# Patient Record
Sex: Female | Born: 1940 | Race: White | Hispanic: No | Smoking: Former smoker
Health system: Southern US, Community
[De-identification: ages and names within clinical notes are randomized; demographics above are authoritative.]

## PROBLEM LIST (undated history)

## (undated) DIAGNOSIS — C50919 Malignant neoplasm of unspecified site of unspecified female breast: Secondary | ICD-10-CM

## (undated) DIAGNOSIS — Z853 Personal history of malignant neoplasm of breast: Principal | ICD-10-CM

## (undated) DIAGNOSIS — E781 Pure hyperglyceridemia: Secondary | ICD-10-CM

## (undated) DIAGNOSIS — I10 Essential (primary) hypertension: Secondary | ICD-10-CM

## (undated) DIAGNOSIS — M1990 Unspecified osteoarthritis, unspecified site: Secondary | ICD-10-CM

## (undated) DIAGNOSIS — E039 Hypothyroidism, unspecified: Secondary | ICD-10-CM

## (undated) DIAGNOSIS — C439 Malignant melanoma of skin, unspecified: Secondary | ICD-10-CM

## (undated) HISTORY — PX: MELANOMA EXCISION: SHX5266

## (undated) HISTORY — DX: Essential (primary) hypertension: I10

## (undated) HISTORY — DX: Malignant melanoma of skin, unspecified: C43.9

## (undated) HISTORY — PX: SKIN BIOPSY: SHX1

## (undated) HISTORY — PX: TUBAL LIGATION: SHX77

## (undated) HISTORY — DX: Personal history of malignant neoplasm of breast: Z85.3

## (undated) HISTORY — PX: BREAST LUMPECTOMY: SHX2

## (undated) HISTORY — PX: ECTOPIC PREGNANCY SURGERY: SHX613

## (undated) HISTORY — DX: Unspecified osteoarthritis, unspecified site: M19.90

## (undated) HISTORY — DX: Malignant neoplasm of unspecified site of unspecified female breast: C50.919

## (undated) HISTORY — DX: Pure hyperglyceridemia: E78.1

## (undated) HISTORY — PX: TONSILLECTOMY: SUR1361

---

## 2000-02-07 ENCOUNTER — Encounter: Payer: Self-pay | Admitting: Internal Medicine

## 2000-02-07 ENCOUNTER — Ambulatory Visit (HOSPITAL_COMMUNITY): Admission: RE | Admit: 2000-02-07 | Discharge: 2000-02-07 | Payer: Self-pay | Admitting: Internal Medicine

## 2000-03-09 ENCOUNTER — Other Ambulatory Visit: Admission: RE | Admit: 2000-03-09 | Discharge: 2000-03-09 | Payer: Self-pay | Admitting: *Deleted

## 2000-03-09 ENCOUNTER — Encounter (INDEPENDENT_AMBULATORY_CARE_PROVIDER_SITE_OTHER): Payer: Self-pay

## 2000-06-27 ENCOUNTER — Other Ambulatory Visit: Admission: RE | Admit: 2000-06-27 | Discharge: 2000-06-27 | Payer: Self-pay | Admitting: Internal Medicine

## 2001-02-14 ENCOUNTER — Encounter: Payer: Self-pay | Admitting: Internal Medicine

## 2001-02-14 ENCOUNTER — Ambulatory Visit (HOSPITAL_COMMUNITY): Admission: RE | Admit: 2001-02-14 | Discharge: 2001-02-14 | Payer: Self-pay | Admitting: Internal Medicine

## 2001-07-18 ENCOUNTER — Encounter: Payer: Self-pay | Admitting: Emergency Medicine

## 2001-07-18 ENCOUNTER — Emergency Department (HOSPITAL_COMMUNITY): Admission: EM | Admit: 2001-07-18 | Discharge: 2001-07-18 | Payer: Self-pay | Admitting: Emergency Medicine

## 2001-11-08 ENCOUNTER — Other Ambulatory Visit: Admission: RE | Admit: 2001-11-08 | Discharge: 2001-11-08 | Payer: Self-pay | Admitting: Internal Medicine

## 2002-02-19 ENCOUNTER — Encounter: Payer: Self-pay | Admitting: Internal Medicine

## 2002-02-19 ENCOUNTER — Ambulatory Visit (HOSPITAL_COMMUNITY): Admission: RE | Admit: 2002-02-19 | Discharge: 2002-02-19 | Payer: Self-pay | Admitting: Internal Medicine

## 2003-05-30 ENCOUNTER — Other Ambulatory Visit: Admission: RE | Admit: 2003-05-30 | Discharge: 2003-05-30 | Payer: Self-pay | Admitting: Obstetrics and Gynecology

## 2003-06-16 ENCOUNTER — Ambulatory Visit (HOSPITAL_COMMUNITY): Admission: RE | Admit: 2003-06-16 | Discharge: 2003-06-16 | Payer: Self-pay | Admitting: Obstetrics and Gynecology

## 2003-07-07 ENCOUNTER — Encounter: Admission: RE | Admit: 2003-07-07 | Discharge: 2003-07-07 | Payer: Self-pay | Admitting: Obstetrics and Gynecology

## 2004-07-25 DIAGNOSIS — C439 Malignant melanoma of skin, unspecified: Secondary | ICD-10-CM

## 2004-07-25 HISTORY — DX: Malignant melanoma of skin, unspecified: C43.9

## 2004-07-28 ENCOUNTER — Ambulatory Visit (HOSPITAL_COMMUNITY): Admission: RE | Admit: 2004-07-28 | Discharge: 2004-07-28 | Payer: Self-pay | Admitting: Obstetrics and Gynecology

## 2004-07-29 ENCOUNTER — Other Ambulatory Visit: Admission: RE | Admit: 2004-07-29 | Discharge: 2004-07-29 | Payer: Self-pay | Admitting: Obstetrics and Gynecology

## 2005-09-20 ENCOUNTER — Other Ambulatory Visit: Admission: RE | Admit: 2005-09-20 | Discharge: 2005-09-20 | Payer: Self-pay | Admitting: Obstetrics & Gynecology

## 2005-10-11 ENCOUNTER — Encounter: Admission: RE | Admit: 2005-10-11 | Discharge: 2005-10-11 | Payer: Self-pay | Admitting: Obstetrics and Gynecology

## 2005-11-08 ENCOUNTER — Encounter: Admission: RE | Admit: 2005-11-08 | Discharge: 2005-11-08 | Payer: Self-pay | Admitting: Obstetrics and Gynecology

## 2006-11-03 ENCOUNTER — Ambulatory Visit (HOSPITAL_COMMUNITY): Admission: RE | Admit: 2006-11-03 | Discharge: 2006-11-03 | Payer: Self-pay | Admitting: Family Medicine

## 2006-11-07 ENCOUNTER — Other Ambulatory Visit: Admission: RE | Admit: 2006-11-07 | Discharge: 2006-11-07 | Payer: Self-pay | Admitting: Obstetrics and Gynecology

## 2007-12-06 ENCOUNTER — Ambulatory Visit (HOSPITAL_COMMUNITY): Admission: RE | Admit: 2007-12-06 | Discharge: 2007-12-06 | Payer: Self-pay | Admitting: Family Medicine

## 2008-01-18 ENCOUNTER — Other Ambulatory Visit: Admission: RE | Admit: 2008-01-18 | Discharge: 2008-01-18 | Payer: Self-pay | Admitting: Obstetrics and Gynecology

## 2009-01-22 ENCOUNTER — Ambulatory Visit (HOSPITAL_COMMUNITY): Admission: RE | Admit: 2009-01-22 | Discharge: 2009-01-22 | Payer: Self-pay | Admitting: Obstetrics and Gynecology

## 2009-01-22 DIAGNOSIS — C50919 Malignant neoplasm of unspecified site of unspecified female breast: Secondary | ICD-10-CM

## 2009-01-22 HISTORY — DX: Malignant neoplasm of unspecified site of unspecified female breast: C50.919

## 2009-01-30 ENCOUNTER — Encounter: Admission: RE | Admit: 2009-01-30 | Discharge: 2009-01-30 | Payer: Self-pay | Admitting: Obstetrics and Gynecology

## 2009-01-30 ENCOUNTER — Encounter (INDEPENDENT_AMBULATORY_CARE_PROVIDER_SITE_OTHER): Payer: Self-pay | Admitting: Diagnostic Radiology

## 2009-02-06 ENCOUNTER — Encounter: Admission: RE | Admit: 2009-02-06 | Discharge: 2009-02-06 | Payer: Self-pay | Admitting: Obstetrics and Gynecology

## 2009-02-10 ENCOUNTER — Encounter: Admission: RE | Admit: 2009-02-10 | Discharge: 2009-02-10 | Payer: Self-pay | Admitting: Obstetrics and Gynecology

## 2009-02-10 ENCOUNTER — Encounter (INDEPENDENT_AMBULATORY_CARE_PROVIDER_SITE_OTHER): Payer: Self-pay | Admitting: Diagnostic Radiology

## 2009-02-25 ENCOUNTER — Encounter: Admission: RE | Admit: 2009-02-25 | Discharge: 2009-02-25 | Payer: Self-pay | Admitting: Surgery

## 2009-02-26 ENCOUNTER — Encounter (INDEPENDENT_AMBULATORY_CARE_PROVIDER_SITE_OTHER): Payer: Self-pay | Admitting: Surgery

## 2009-02-26 ENCOUNTER — Ambulatory Visit (HOSPITAL_BASED_OUTPATIENT_CLINIC_OR_DEPARTMENT_OTHER): Admission: RE | Admit: 2009-02-26 | Discharge: 2009-02-26 | Payer: Self-pay | Admitting: Surgery

## 2009-02-26 ENCOUNTER — Encounter: Admission: RE | Admit: 2009-02-26 | Discharge: 2009-02-26 | Payer: Self-pay | Admitting: Surgery

## 2009-03-02 ENCOUNTER — Ambulatory Visit: Payer: Self-pay | Admitting: Oncology

## 2009-03-04 ENCOUNTER — Ambulatory Visit: Payer: Self-pay | Admitting: Genetic Counselor

## 2009-03-06 ENCOUNTER — Ambulatory Visit: Admission: RE | Admit: 2009-03-06 | Discharge: 2009-04-08 | Payer: Self-pay | Admitting: Radiation Oncology

## 2009-03-12 ENCOUNTER — Ambulatory Visit (HOSPITAL_COMMUNITY): Admission: RE | Admit: 2009-03-12 | Discharge: 2009-03-12 | Payer: Self-pay | Admitting: Surgery

## 2009-03-27 ENCOUNTER — Ambulatory Visit (HOSPITAL_COMMUNITY): Admission: RE | Admit: 2009-03-27 | Discharge: 2009-03-27 | Payer: Self-pay | Admitting: Oncology

## 2009-04-27 ENCOUNTER — Ambulatory Visit: Payer: Self-pay | Admitting: Oncology

## 2009-06-16 ENCOUNTER — Ambulatory Visit: Payer: Self-pay | Admitting: Oncology

## 2009-06-22 LAB — COMPREHENSIVE METABOLIC PANEL
ALT: 21 U/L (ref 0–35)
BUN: 17 mg/dL (ref 6–23)
CO2: 29 mEq/L (ref 19–32)
Calcium: 9.7 mg/dL (ref 8.4–10.5)
Chloride: 104 mEq/L (ref 96–112)
Creatinine, Ser: 0.84 mg/dL (ref 0.40–1.20)
Glucose, Bld: 121 mg/dL — ABNORMAL HIGH (ref 70–99)

## 2009-06-22 LAB — CBC WITH DIFFERENTIAL/PLATELET
BASO%: 0.4 % (ref 0.0–2.0)
Basophils Absolute: 0 10*3/uL (ref 0.0–0.1)
Eosinophils Absolute: 0.2 10*3/uL (ref 0.0–0.5)
HCT: 42.4 % (ref 34.8–46.6)
HGB: 14.5 g/dL (ref 11.6–15.9)
MONO#: 0.5 10*3/uL (ref 0.1–0.9)
NEUT#: 4.4 10*3/uL (ref 1.5–6.5)
NEUT%: 68.3 % (ref 38.4–76.8)
Platelets: 279 10*3/uL (ref 145–400)
WBC: 6.5 10*3/uL (ref 3.9–10.3)
lymph#: 1.4 10*3/uL (ref 0.9–3.3)

## 2009-09-21 ENCOUNTER — Ambulatory Visit: Payer: Self-pay | Admitting: Oncology

## 2009-09-23 LAB — CBC WITH DIFFERENTIAL/PLATELET
Basophils Absolute: 0 10*3/uL (ref 0.0–0.1)
Eosinophils Absolute: 0.1 10*3/uL (ref 0.0–0.5)
HCT: 39.3 % (ref 34.8–46.6)
HGB: 13.8 g/dL (ref 11.6–15.9)
MCH: 32.6 pg (ref 25.1–34.0)
MONO#: 0.4 10*3/uL (ref 0.1–0.9)
NEUT#: 2.9 10*3/uL (ref 1.5–6.5)
NEUT%: 60.5 % (ref 38.4–76.8)
WBC: 4.8 10*3/uL (ref 3.9–10.3)
lymph#: 1.3 10*3/uL (ref 0.9–3.3)

## 2009-09-23 LAB — COMPREHENSIVE METABOLIC PANEL
Albumin: 3.9 g/dL (ref 3.5–5.2)
BUN: 17 mg/dL (ref 6–23)
CO2: 22 mEq/L (ref 19–32)
Calcium: 9.2 mg/dL (ref 8.4–10.5)
Chloride: 104 mEq/L (ref 96–112)
Creatinine, Ser: 0.81 mg/dL (ref 0.40–1.20)
Glucose, Bld: 127 mg/dL — ABNORMAL HIGH (ref 70–99)

## 2009-09-28 ENCOUNTER — Ambulatory Visit: Payer: Self-pay | Admitting: Genetic Counselor

## 2010-01-22 ENCOUNTER — Encounter: Admission: RE | Admit: 2010-01-22 | Discharge: 2010-01-22 | Payer: Self-pay | Admitting: Obstetrics and Gynecology

## 2010-02-11 ENCOUNTER — Ambulatory Visit: Payer: Self-pay | Admitting: Genetic Counselor

## 2010-02-15 ENCOUNTER — Ambulatory Visit: Payer: Self-pay | Admitting: Oncology

## 2010-02-15 LAB — CBC WITH DIFFERENTIAL/PLATELET
BASO%: 0.5 % (ref 0.0–2.0)
Basophils Absolute: 0 10*3/uL (ref 0.0–0.1)
EOS%: 2.6 % (ref 0.0–7.0)
Eosinophils Absolute: 0.1 10*3/uL (ref 0.0–0.5)
HCT: 40.8 % (ref 34.8–46.6)
MCH: 32.3 pg (ref 25.1–34.0)
MCV: 92.7 fL (ref 79.5–101.0)
MONO%: 8.1 % (ref 0.0–14.0)
NEUT#: 3.7 10*3/uL (ref 1.5–6.5)
RDW: 12.6 % (ref 11.2–14.5)

## 2010-02-15 LAB — COMPREHENSIVE METABOLIC PANEL
AST: 23 U/L (ref 0–37)
BUN: 12 mg/dL (ref 6–23)
Calcium: 9.8 mg/dL (ref 8.4–10.5)
Chloride: 104 mEq/L (ref 96–112)
Creatinine, Ser: 0.85 mg/dL (ref 0.40–1.20)

## 2010-05-17 ENCOUNTER — Ambulatory Visit: Payer: Self-pay | Admitting: Oncology

## 2010-05-18 LAB — CBC WITH DIFFERENTIAL/PLATELET
BASO%: 0.5 % (ref 0.0–2.0)
Eosinophils Absolute: 0.1 10*3/uL (ref 0.0–0.5)
HGB: 14.4 g/dL (ref 11.6–15.9)
MCHC: 34.8 g/dL (ref 31.5–36.0)
MCV: 93.8 fL (ref 79.5–101.0)
NEUT#: 5.9 10*3/uL (ref 1.5–6.5)
NEUT%: 71.7 % (ref 38.4–76.8)
RBC: 4.4 10*6/uL (ref 3.70–5.45)
lymph#: 1.6 10*3/uL (ref 0.9–3.3)

## 2010-05-18 LAB — COMPREHENSIVE METABOLIC PANEL
ALT: 12 U/L (ref 0–35)
Albumin: 4.6 g/dL (ref 3.5–5.2)
CO2: 24 mEq/L (ref 19–32)
Calcium: 10.7 mg/dL — ABNORMAL HIGH (ref 8.4–10.5)

## 2010-08-12 ENCOUNTER — Ambulatory Visit: Payer: Self-pay | Admitting: Oncology

## 2010-08-16 ENCOUNTER — Encounter: Payer: Self-pay | Admitting: Obstetrics and Gynecology

## 2010-08-16 LAB — COMPREHENSIVE METABOLIC PANEL
AST: 19 U/L (ref 0–37)
Albumin: 4.6 g/dL (ref 3.5–5.2)
Alkaline Phosphatase: 100 U/L (ref 39–117)
BUN: 15 mg/dL (ref 6–23)
Creatinine, Ser: 0.78 mg/dL (ref 0.40–1.20)
Glucose, Bld: 139 mg/dL — ABNORMAL HIGH (ref 70–99)

## 2010-08-16 LAB — CBC WITH DIFFERENTIAL/PLATELET
BASO%: 0.3 % (ref 0.0–2.0)
Eosinophils Absolute: 0.2 10*3/uL (ref 0.0–0.5)
HGB: 14.7 g/dL (ref 11.6–15.9)
MCV: 94.1 fL (ref 79.5–101.0)
MONO%: 6.3 % (ref 0.0–14.0)
NEUT#: 5 10*3/uL (ref 1.5–6.5)
NEUT%: 75.5 % (ref 38.4–76.8)
Platelets: 274 10*3/uL (ref 145–400)
RBC: 4.51 10*6/uL (ref 3.70–5.45)
WBC: 6.7 10*3/uL (ref 3.9–10.3)

## 2010-10-30 LAB — COMPREHENSIVE METABOLIC PANEL
Albumin: 4.1 g/dL (ref 3.5–5.2)
Alkaline Phosphatase: 82 U/L (ref 39–117)
BUN: 13 mg/dL (ref 6–23)
Chloride: 105 mEq/L (ref 96–112)
Creatinine, Ser: 0.94 mg/dL (ref 0.4–1.2)
GFR calc non Af Amer: 59 mL/min — ABNORMAL LOW (ref >60)
Glucose, Bld: 118 mg/dL — ABNORMAL HIGH (ref 70–99)
Total Bilirubin: 0.8 mg/dL (ref 0.3–1.2)

## 2010-10-30 LAB — URINALYSIS, ROUTINE W REFLEX MICROSCOPIC
Glucose, UA: NEGATIVE mg/dL
Hgb urine dipstick: NEGATIVE
Ketones, ur: NEGATIVE mg/dL
Protein, ur: NEGATIVE mg/dL
pH: 7 (ref 5.0–8.0)

## 2010-10-30 LAB — CBC
HCT: 39.3 % (ref 36.0–46.0)
HCT: 41.1 % (ref 36.0–46.0)
Hemoglobin: 14.1 g/dL (ref 12.0–15.0)
MCV: 94.9 fL (ref 78.0–100.0)
MCV: 95.9 fL (ref 78.0–100.0)
Platelets: 265 10*3/uL (ref 150–400)
RBC: 4.1 MIL/uL (ref 3.87–5.11)
RDW: 13.2 % (ref 11.5–15.5)
WBC: 5.7 10*3/uL (ref 4.0–10.5)
WBC: 7.7 10*3/uL (ref 4.0–10.5)

## 2010-10-30 LAB — BASIC METABOLIC PANEL
Chloride: 107 mEq/L (ref 96–112)
GFR calc Af Amer: >60 mL/min (ref >60)
GFR calc non Af Amer: >60 mL/min (ref >60)
Potassium: 4.3 mEq/L (ref 3.5–5.1)

## 2010-10-30 LAB — DIFFERENTIAL
Eosinophils Absolute: 0.2 10*3/uL (ref 0.0–0.7)
Eosinophils Relative: 4 % (ref 0–5)
Lymphocytes Relative: 24 % (ref 12–46)
Lymphs Abs: 1.4 10*3/uL (ref 0.7–4.0)
Monocytes Absolute: 0.4 10*3/uL (ref 0.1–1.0)
Monocytes Relative: 8 % (ref 3–12)

## 2010-12-07 NOTE — Op Note (Signed)
[name], Shelby Hood               ACCOUNT NO.:  [id number]   MEDICAL RECORD NO.:  [id number]          PATIENT TYPE:  AMB   LOCATION:  SDS                          FACILITY:  [facility]   PHYSICIAN:  Currie Paris, M.D.DATE OF BIRTH:  [birth year]   DATE OF PROCEDURE:  03/12/2009  DATE OF DISCHARGE:  03/12/2009                               OPERATIVE REPORT   PREOPERATIVE DIAGNOSIS:  Bilateral breast cancers, upper outer quadrant,  clinical stage I.   POSTOPERATIVE DIAGNOSIS:  Bilateral breast cancers, upper outer  quadrant, clinical stage I.   OPERATION:  Bilateral MammoSite placement.   SURGEON:  Currie Paris, MD   ANESTHESIA:  General.   CLINICAL HISTORY:  This is a 70 year old lady status post bilateral  lumpectomies.  After discussion of radiation therapy, she wished to have  a MammoSite placed for her radiation therapy.   DESCRIPTION OF PROCEDURE:  I saw the patient in the holding area and we  confirmed bilateral MammoSite placement as the planned procedure.   The patient taken to the operating room.  After satisfactory general  anesthesia had been obtained, both breasts were prepped and draped and a  time-out was done.   I looked at the right side first and made a small opening in the prior  scar laterally.  She appeared to have a hematoma here, and I was able to  get into this, suctioned out and irrigated.  I felt she had a fairly  large cavity such that we would put the larger of the 2 MammoSites in  here.  I thought, however, that this should work fine and she would be a  good candidate for MammoSite on the right side.   I then went to the left side and opened on the lateral edge of that  incision, got into the seroma cavity as expected and this appeared to be  about the right size for the standard MammoSite.  Again, I thought this  side would be appropriate for MammoSite placement.   As I already had the scar opened, I put the smaller standard MammoSite  in the left side.  We tested it for symmetry prior to placing it.  I  inflated it with 40 mL of saline contrast mixed and I thought we had  good filling of the cavity.  I put a suture just medial to it using 4-0  Prolene to make sure that there was not stress on the suture line from  the MammoSite catheter.   I then approached the right side and placed the larger spherical device  here after testing it.  I inflated this to 70 mL and I thought this was  the max that we can get in.  However, if we needed more there would be  space for more since this would accommodate a larger amount of fluid.  Again, I put a suture medial to try to prevent any leverage from the  catheter from opening the remainder of the scar.   I then ultrasound both sides and it looked like we had at least 1.5 cm  of skin to  balloon distance.  I can see really fluid around the  balloons, although it looked like there, perhaps, was a little bit of  tissue, edema, or fluid subcutaneously but not of significance.   Sterile dressings were applied.  The patient tolerated the procedure  well.  There were no complications.  All counts were correct.      Currie Paris, M.D.  Electronically Signed     CJS/MEDQ  D:  03/12/2009  T:  03/13/2009  Job:  [id number]

## 2010-12-07 NOTE — Op Note (Signed)
Shelby Hood, Shelby Hood NO.:  [id number]   MEDICAL RECORD NO.:  [id number]          PATIENT TYPE:  AMB   LOCATION:  DSC                          FACILITY:  [facility]   PHYSICIAN:  Currie Paris, M.D.DATE OF BIRTH:  03/09/1941   DATE OF PROCEDURE:  02/26/2009  DATE OF DISCHARGE:                               OPERATIVE REPORT   PREOPERATIVE DIAGNOSES:  1. Right breast cancer upper inner quadrant, clinical stage I.  2. Left breast cancer upper outer quadrant, clinical stage I.   POSTOPERATIVE DIAGNOSES:  1. Right breast cancer upper inner quadrant, clinical stage I.  2. Left breast cancer upper outer quadrant, clinical stage I.   OPERATION:  Needle guided excision of bilateral breast cancers with  bilateral blue dye injection and bilateral axillary sentinel lymph node  biopsies.   SURGEON:  Currie Paris, M.D.   ANESTHESIA:  General.   CLINICAL HISTORY:  This is a 70 year old lady recently found to have a  left breast cancer on mammography.  This was proven by biopsy.  Further  evaluation with MRI showed a lesion in the left breast and this was also  found to be carcinoma.  After lengthy discussion, she elected to have  bilateral lumpectomies with sentinel node evaluations.  She did not wish  to have a full node dissection even if her sentinel node were positive.   DESCRIPTION OF PROCEDURE:  I saw the patient in the holding area and she  had no further questions.  I reviewed the prior mammogram films with the  localizing guidewires in place.   The patient was taken to the operating room.  After satisfactory general  anesthesia had been obtained, the time-out was done.  I then injected  both subareolar areas with 5 mL of dilute methylene blue and this was  massaged in.   Formal sterile prep and drape was then done.   I began on the right side.  I identified a sentinel node area with the  probe and made a transverse incision at the base of the  axillary hair  and divided the subcutaneous tissues until I entered the axilla.  Almost  immediately, I saw a pink somewhat large lymph node and it had elevated  counts.  This was excised and had counts of about 2000.  As there was no  blue dye noted in any place in the axilla and I saw no lymphatics coming  in.   I then used a Neoprobe and I could find no counts above 0-5 in any place  in the axilla.  There is no palpable abnormal axillary adenopathy.   I put 0.25% plain Marcaine in to help with postop pain relief and closed  in layers with 3-0 Vicryl, 4-0 Monocryl subcuticular and at the very end  Dermabond.   I then turned my attention to the right breast and made a transverse  incision starting at the guidewire site which entered medially and  tracked laterally.  There was a large ecchymosis and bruise here from  the biopsy.  I elevated thin skin flap superiorly, inferiorly, laterally  and then a little bit medially.  Going medial, I went down to the chest  wall and took a wide excision full-thickness of skin off of breast  tissue starting at the guidewire and going till I was beyond the tip  including a little bit of subareolar tissue.  This was all done with a  cautery.  My deep margin was the fascia which I took.   Specimen mammogram was done, it showed the clip to be approximately in  the middle of the specimen.  The specimen was inked for orientation  purposes and sent to pathology.   I put Marcaine in here.  I elevated the breasts off of the fascia and  closed the deeper breast tissue to try to diminish the defect somewhat.  I made a little bit longer skin flaps and then closed some of the  subcutaneous tissues but there was a bit of the cavity left behind and I  thought she might actually be a candidate for postop MammoSite  radiation.  We did irrigate before closing and made sure everything was  dry.  I closed the skin with 4-0 Monocryl subcuticular and Dermabond.    The left side had been completely covered while we were working on the  right, we now covered the right side completely and used all new gloves,  instruments, etc. to do the left side.   I did the left side essentially identical to the right.  I did find 3  lymph nodes on the left side, the hottest of which had 2000 and the  other 2 had about 700 and 300 respectively.  Again, there were no other  hot areas, no blue dye came into the left axilla and there is no  palpable adenopathy.   For the lumpectomy, the guidewire entered lateral and tracked medial and  I tried to estimate where the tumor was based on the measurements and  again did a full thickness down to and including fascia excision.  This  was closed in a similar fashion again using some local.  Again, the  specimen mammogram showed that the clip appeared to be pretty much in  the center somewhat towards the medial end of the specimen.   Dr. Colonel Bald reported all the lymph nodes were negative.   The patient tolerated the procedure well.  There were no operative  complications.  All counts were correct.      Currie Paris, M.D.  Electronically Signed     CJS/MEDQ  D:  02/26/2009  T:  02/27/2009  Job:  [id number]   cc:   Caryn Bee L. Little, M.D.  Cynthia P. Romine, M.D.

## 2011-01-11 ENCOUNTER — Other Ambulatory Visit: Payer: Self-pay | Admitting: Family Medicine

## 2011-01-11 DIAGNOSIS — Z853 Personal history of malignant neoplasm of breast: Secondary | ICD-10-CM

## 2011-02-04 ENCOUNTER — Other Ambulatory Visit: Payer: Self-pay | Admitting: Dermatology

## 2011-02-14 ENCOUNTER — Other Ambulatory Visit: Payer: Self-pay | Admitting: Oncology

## 2011-02-14 ENCOUNTER — Ambulatory Visit
Admission: RE | Admit: 2011-02-14 | Discharge: 2011-02-14 | Disposition: A | Discharge status: Home / Self Care | Payer: BC Managed Care – PPO | Source: Ambulatory Visit | Attending: Family Medicine | Admitting: Family Medicine

## 2011-02-14 ENCOUNTER — Encounter (HOSPITAL_BASED_OUTPATIENT_CLINIC_OR_DEPARTMENT_OTHER): Payer: BC Managed Care – PPO | Admitting: Oncology

## 2011-02-14 DIAGNOSIS — Z853 Personal history of malignant neoplasm of breast: Secondary | ICD-10-CM

## 2011-02-14 DIAGNOSIS — C50419 Malignant neoplasm of upper-outer quadrant of unspecified female breast: Secondary | ICD-10-CM

## 2011-02-14 DIAGNOSIS — E039 Hypothyroidism, unspecified: Secondary | ICD-10-CM

## 2011-02-14 DIAGNOSIS — C50919 Malignant neoplasm of unspecified site of unspecified female breast: Secondary | ICD-10-CM

## 2011-02-14 DIAGNOSIS — M810 Age-related osteoporosis without current pathological fracture: Secondary | ICD-10-CM

## 2011-02-14 DIAGNOSIS — Z79899 Other long term (current) drug therapy: Secondary | ICD-10-CM

## 2011-02-14 DIAGNOSIS — I10 Essential (primary) hypertension: Secondary | ICD-10-CM

## 2011-02-14 LAB — CBC WITH DIFFERENTIAL/PLATELET
Basophils Absolute: 0 10*3/uL (ref 0.0–0.1)
HCT: 40.1 % (ref 34.8–46.6)
HGB: 13.9 g/dL (ref 11.6–15.9)
LYMPH%: 24.8 % (ref 14.0–49.7)
MCH: 32.2 pg (ref 25.1–34.0)
MCHC: 34.7 g/dL (ref 31.5–36.0)
MONO#: 0.4 10*3/uL (ref 0.1–0.9)
NEUT%: 62.8 % (ref 38.4–76.8)
Platelets: 267 10*3/uL (ref 145–400)
lymph#: 1.3 10*3/uL (ref 0.9–3.3)

## 2011-02-14 LAB — COMPREHENSIVE METABOLIC PANEL
BUN: 16 mg/dL (ref 6–23)
CO2: 25 mEq/L (ref 19–32)
Calcium: 9.9 mg/dL (ref 8.4–10.5)
Chloride: 101 mEq/L (ref 96–112)
Creatinine, Ser: 0.78 mg/dL (ref 0.50–1.10)
Total Bilirubin: 0.5 mg/dL (ref 0.3–1.2)

## 2011-03-29 ENCOUNTER — Ambulatory Visit
Admission: RE | Admit: 2011-03-29 | Discharge: 2011-03-29 | Disposition: A | Discharge status: Home / Self Care | Payer: BC Managed Care – PPO | Source: Ambulatory Visit | Attending: Oncology | Admitting: Oncology

## 2011-03-29 ENCOUNTER — Other Ambulatory Visit: Payer: Self-pay | Admitting: Oncology

## 2011-03-29 DIAGNOSIS — C50919 Malignant neoplasm of unspecified site of unspecified female breast: Secondary | ICD-10-CM

## 2011-03-29 DIAGNOSIS — Z79899 Other long term (current) drug therapy: Secondary | ICD-10-CM

## 2011-04-04 ENCOUNTER — Other Ambulatory Visit: Payer: Medicare Other

## 2011-04-04 ENCOUNTER — Ambulatory Visit
Admission: RE | Admit: 2011-04-04 | Discharge: 2011-04-04 | Disposition: A | Discharge status: Home / Self Care | Payer: Medicare Other | Source: Ambulatory Visit | Attending: Oncology | Admitting: Oncology

## 2011-04-04 DIAGNOSIS — C50919 Malignant neoplasm of unspecified site of unspecified female breast: Secondary | ICD-10-CM

## 2011-04-04 DIAGNOSIS — Z79899 Other long term (current) drug therapy: Secondary | ICD-10-CM

## 2011-07-23 ENCOUNTER — Telehealth: Payer: Self-pay | Admitting: Oncology

## 2011-07-23 NOTE — Telephone Encounter (Signed)
Mailed the pt her jan 2013 appt calendar unable to contact by telephone.

## 2011-08-07 ENCOUNTER — Encounter: Payer: Self-pay | Admitting: Oncology

## 2011-08-10 ENCOUNTER — Encounter: Payer: Self-pay | Admitting: *Deleted

## 2011-08-12 ENCOUNTER — Telehealth: Payer: Self-pay | Admitting: Oncology

## 2011-08-12 ENCOUNTER — Encounter: Payer: Self-pay | Admitting: *Deleted

## 2011-08-12 NOTE — Telephone Encounter (Signed)
pt called and r/s appt on 1-23 to 2-01

## 2011-08-17 ENCOUNTER — Ambulatory Visit: Payer: Medicare Other | Admitting: Oncology

## 2011-08-17 ENCOUNTER — Other Ambulatory Visit: Payer: Medicare Other

## 2011-08-25 ENCOUNTER — Ambulatory Visit: Payer: Medicare Other | Admitting: Oncology

## 2011-08-26 ENCOUNTER — Telehealth: Payer: Self-pay | Admitting: Oncology

## 2011-08-26 ENCOUNTER — Other Ambulatory Visit: Payer: Medicare Other | Admitting: Lab

## 2011-08-26 ENCOUNTER — Ambulatory Visit (HOSPITAL_BASED_OUTPATIENT_CLINIC_OR_DEPARTMENT_OTHER): Payer: Medicare Other | Admitting: Oncology

## 2011-08-26 VITALS — BP 147/79 | HR 88 | Temp 96.9°F | Ht 67.5 in | Wt 176.0 lb

## 2011-08-26 DIAGNOSIS — I10 Essential (primary) hypertension: Secondary | ICD-10-CM

## 2011-08-26 DIAGNOSIS — C50219 Malignant neoplasm of upper-inner quadrant of unspecified female breast: Secondary | ICD-10-CM

## 2011-08-26 DIAGNOSIS — Z853 Personal history of malignant neoplasm of breast: Secondary | ICD-10-CM

## 2011-08-26 DIAGNOSIS — C50919 Malignant neoplasm of unspecified site of unspecified female breast: Secondary | ICD-10-CM

## 2011-08-26 DIAGNOSIS — E039 Hypothyroidism, unspecified: Secondary | ICD-10-CM

## 2011-08-26 LAB — COMPREHENSIVE METABOLIC PANEL
ALT: 14 U/L (ref 0–35)
CO2: 24 mEq/L (ref 19–32)
Calcium: 9.9 mg/dL (ref 8.4–10.5)
Chloride: 101 mEq/L (ref 96–112)
Sodium: 137 mEq/L (ref 135–145)
Total Protein: 6.9 g/dL (ref 6.0–8.3)

## 2011-08-26 LAB — CBC WITH DIFFERENTIAL/PLATELET
BASO%: 0.4 % (ref 0.0–2.0)
Eosinophils Absolute: 0.2 10*3/uL (ref 0.0–0.5)
HCT: 45.6 % (ref 34.8–46.6)
MCHC: 34.1 g/dL (ref 31.5–36.0)
MONO#: 0.4 10*3/uL (ref 0.1–0.9)
NEUT#: 4.7 10*3/uL (ref 1.5–6.5)
NEUT%: 64.1 % (ref 38.4–76.8)
WBC: 7.3 10*3/uL (ref 3.9–10.3)
lymph#: 2 10*3/uL (ref 0.9–3.3)

## 2011-08-26 NOTE — Telephone Encounter (Signed)
appt made for 7/31 and mammo for 7/24 at 1;30,pt placed in tablet   aom

## 2011-08-26 NOTE — Progress Notes (Signed)
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  Her right breast invasive ductal carcinoma measured 1.4 cm; grade 2; status post lumpectomy with the closest margin 0.4 cm from the inferior margin without DCIS, without lymphovascular invasion; ER was 100%, PR 1%, Ki-67 41%, HER-2/neu by CISH was negative at 1.14; 0 out of 3 lymph nodes positive.  PAST THERAPY:  Status post bilateral lumpectomy with sentinel lymph node biopsy bilaterally on February 26, 2009.  She is s/p MammoSite radiation adjuvantly. She was on adjuvant letrozole between August 2010 and late 2012.  She self-discontinued due to myalgia/arthralgia.   CURRENT THERAPY: watchful observation.   INTERVAL HISTORY: Shelby Hood 71 y.o. female returns for regular follow up. She had been having lots of myalgia and arthralgia being on aromatase inhibitor. She said that her quality of life was severely compromised. Therefore she self discontinued better so late last year without telling us. Since then her myalgia and arthralgia have improved significantly. She is not interested in switching to another aromatase inhibitor or tamoxifen.  Patient denies fatigue, headache, visual changes, confusion, drenching night sweats, palpable lymph node swelling, mucositis, odynophagia, dysphagia, nausea vomiting, jaundice, chest pain, palpitation, shortness of breath, dyspnea on exertion, productive cough, gum bleeding, epistaxis, hematemesis, hemoptysis, abdominal pain, abdominal swelling, early satiety, melena, hematochezia, hematuria, skin rash, spontaneous bleeding,  joint swelling, joint pain, heat or cold intolerance, bowel bladder incontinence, back pain, focal motor weakness, paresthesia, depression, suicidal or homocidal ideation, feeling hopelessness.   MEDICAL HISTORY: Past Medical History  Diagnosis Date  . Breast cancer 01/2009  . Osteoarthritis   . Hypertension   . Hypertriglyceridemia   . Melanoma 2006    stage I; right anterior chest wall; s/p resection     SURGICAL HISTORY: No past surgical history on file.  MEDICATIONS: Current Outpatient Prescriptions  Medication Sig Dispense Refill  . aspirin 81 MG tablet Take 81 mg by mouth daily.      Marland Kitchen ibuprofen (ADVIL,MOTRIN) 200 MG tablet Take 200 mg by mouth every 6 (six) hours as needed.      Marland Kitchen letrozole (FEMARA) 2.5 MG tablet Take 2.5 mg by mouth daily.      Marland Kitchen levothyroxine (SYNTHROID, LEVOTHROID) 100 MCG tablet Take 100 mcg by mouth daily.      Marland Kitchen telmisartan (MICARDIS) 80 MG tablet Take 80 mg by mouth daily.        ALLERGIES:  is allergic to codeine and tetanus toxoids.  REVIEW OF SYSTEMS:  The rest of the 14-point review of system was negative.   Filed Vitals:   08/26/11 1411  BP: 147/79  Pulse: 88  Temp: 96.9 F (36.1 C)   Wt Readings from Last 3 Encounters:  08/26/11 176 lb (79.833 kg)  02/14/11 182 lb (82.555 kg)   ECOG Performance status: 0  PHYSICAL EXAMINATION:   General:  well-nourished in no acute distress.  Eyes:  no scleral icterus.  ENT:  There were no oropharyngeal lesions.  Neck was without thyromegaly.  Lymphatics:  Negative cervical, supraclavicular or axillary adenopathy.  Respiratory: lungs were clear bilaterally without wheezing or crackles.  Cardiovascular:  Regular rate and rhythm, S1/S2, without murmur, rub or gallop.  There was no pedal edema.  GI:  abdomen was soft, flat, nontender, nondistended, without organomegaly.  Muscoloskeletal:  no spinal tenderness of palpation of vertebral spine.  Skin exam was without echymosis, petichae.  Neuro exam was  nonfocal.  Patient was able to get on and off exam table without assistance.  Gait was normal.  Patient was alerted and oriented.  Attention was good.   Language was appropriate.  Mood was normal without depression.  Speech was not pressured.  Thought content was not tangential.  Bilateral breast exam was negative for palpable breast mass, skin thickening, erythema, or pain.    LABORATORY/RADIOLOGY DATA:  Lab Results  Component Value Date   WBC 7.3 08/26/2011   HGB 15.5 08/26/2011   HCT 45.6 08/26/2011   PLT 331 08/26/2011   GLUCOSE 86 02/14/2011   ALT 12 02/14/2011   AST 17 02/14/2011   NA 139 02/14/2011   K 4.7 02/14/2011   CL 101 02/14/2011   CREATININE 0.78 02/14/2011   BUN 16 02/14/2011   CO2 25 02/14/2011    ASSESSMENT AND PLAN:   1. History of breast cancer:  Dr. Alois Cliche discontinued her AI.  She is well aware of her relatively high risk of recurrence given bilateral breast cancer history.  I explained to her that sometime another aromatase inhibitor may not carry the risk of myalgia arthralgia. In addition tamoxifen does not have known margin arthralgia. Dr. Alois Cliche expressed her decisions that she does not want to take any more hormonal therapy for her history of breast cancer since she believes that any hormonal therapy will cause her to have decreased quality of life. 2. Surveillance:  Her next bilateral mammogram is due in July 2013.  Her last DEXA bone density in 03/2011 showed no osteopenia or osteoporosis.  As she is not on an AI, no further DEXA scan is indicated from oncology standpoint.  Her PCP may elect to repeat DEXA in the future for primary care purposes.  3. Hypothyroidism.  She is on levothyroxine per PCP. 4. Hypertension.  Slightly elevated systolic blood pressure.  She is on telmisartan per PCP. 5. Age appropriate cancer screening:  she is not up to date with colon cancer screening.  She still wants to defer this indefinitely against my advise today.  6. Follow up:  Lab/RV with  me in about 6 months.  I advised her to follow up with [place] sooner if there are concerning symptoms.   The length of time of the face-to-face encounter was 15 minutes. More than 50% of time was spent counseling and coordination of care.

## 2012-02-13 ENCOUNTER — Other Ambulatory Visit: Payer: Medicare Other | Admitting: Lab

## 2012-02-13 ENCOUNTER — Ambulatory Visit: Payer: Medicare Other | Admitting: Oncology

## 2012-02-20 ENCOUNTER — Ambulatory Visit
Admission: RE | Admit: 2012-02-20 | Discharge: 2012-02-20 | Disposition: A | Discharge status: Home / Self Care | Payer: Medicare Other | Source: Ambulatory Visit | Attending: Oncology | Admitting: Oncology

## 2012-02-20 DIAGNOSIS — Z853 Personal history of malignant neoplasm of breast: Secondary | ICD-10-CM

## 2012-02-22 ENCOUNTER — Other Ambulatory Visit (HOSPITAL_BASED_OUTPATIENT_CLINIC_OR_DEPARTMENT_OTHER): Payer: Medicare Other | Admitting: Lab

## 2012-02-22 ENCOUNTER — Ambulatory Visit (HOSPITAL_BASED_OUTPATIENT_CLINIC_OR_DEPARTMENT_OTHER): Payer: Medicare Other | Admitting: Oncology

## 2012-02-22 ENCOUNTER — Encounter: Payer: Self-pay | Admitting: Oncology

## 2012-02-22 VITALS — BP 156/76 | HR 60 | Temp 97.0°F | Ht 67.5 in | Wt 180.7 lb

## 2012-02-22 DIAGNOSIS — C50919 Malignant neoplasm of unspecified site of unspecified female breast: Secondary | ICD-10-CM

## 2012-02-22 DIAGNOSIS — Z853 Personal history of malignant neoplasm of breast: Secondary | ICD-10-CM | POA: Insufficient documentation

## 2012-02-22 DIAGNOSIS — I10 Essential (primary) hypertension: Secondary | ICD-10-CM

## 2012-02-22 DIAGNOSIS — E039 Hypothyroidism, unspecified: Secondary | ICD-10-CM

## 2012-02-22 DIAGNOSIS — M25569 Pain in unspecified knee: Secondary | ICD-10-CM

## 2012-02-22 HISTORY — DX: Personal history of malignant neoplasm of breast: Z85.3

## 2012-02-22 LAB — COMPREHENSIVE METABOLIC PANEL
ALT: 13 U/L (ref 0–35)
CO2: 25 mEq/L (ref 19–32)
Calcium: 10.1 mg/dL (ref 8.4–10.5)
Chloride: 106 mEq/L (ref 96–112)
Sodium: 143 mEq/L (ref 135–145)
Total Bilirubin: 0.7 mg/dL (ref 0.3–1.2)
Total Protein: 6.8 g/dL (ref 6.0–8.3)

## 2012-02-22 LAB — CBC WITH DIFFERENTIAL/PLATELET
BASO%: 1 % (ref 0.0–2.0)
MCHC: 34 g/dL (ref 31.5–36.0)
MONO#: 0.5 10*3/uL (ref 0.1–0.9)
RBC: 4.32 10*6/uL (ref 3.70–5.45)
RDW: 12.5 % (ref 11.2–14.5)
WBC: 5.6 10*3/uL (ref 3.9–10.3)
lymph#: 1.6 10*3/uL (ref 0.9–3.3)

## 2012-02-22 NOTE — Progress Notes (Signed)
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 Her right breast invasive ductal carcinoma measured 1.4 cm; grade 2; status post lumpectomy with the closest margin 0.4 cm from the inferior margin without DCIS, without lymphovascular invasion; ER was 100%, PR 1%, Ki-67 41%, HER-2/neu by CISH was negative at 1.14; 0 out of 3 lymph nodes positive.   PAST THERAPY: Status post bilateral lumpectomy with sentinel lymph node biopsy bilaterally on February 26, 2009. She is s/p MammoSite radiation adjuvantly. She was on adjuvant letrozole between August 2010 and late 2012. She self-discontinued due to myalgia/arthralgia.   CURRENT THERAPY: watchful observation.   INTERVAL HISTORY: Shelby Hood 71 y.o. female returns for regular follow up by herself.  She reports feeling well.  She performs occasional self breast exam; however, she has not found any breast abnormality.  She still has bilateral knee pain which she takes topical pain meds for.   Patient denies fever, anorexia, weight loss, fatigue, headache, visual changes, confusion, drenching night sweats, palpable lymph node swelling, mucositis, odynophagia, dysphagia, nausea vomiting, jaundice, chest pain, palpitation, shortness of breath, dyspnea on exertion, productive cough, gum bleeding, epistaxis, hematemesis, hemoptysis, abdominal pain, abdominal swelling, early satiety, melena, hematochezia, hematuria, skin rash, spontaneous bleeding,heat or cold intolerance, bowel bladder incontinence, back pain, focal motor  weakness, paresthesia, depression, suicidal or homocidal ideation, feeling hopelessness.   Past Medical History  Diagnosis Date  . Breast cancer 01/2009  . Osteoarthritis   . Hypertension   . Hypertriglyceridemia   . Melanoma 2006    stage I; right anterior chest wall; s/p resection   . History of breast cancer 02/22/2012    No past surgical history on file.  Current Outpatient Prescriptions  Medication Sig Dispense Refill  . gemfibrozil (LOPID) 600 MG tablet Take 600 mg by mouth daily.      Marland Kitchen glucosamine-chondroitin 500-400 MG tablet Take 1 tablet by mouth as needed. 3 tablets daily      . ibuprofen (ADVIL,MOTRIN) 200 MG tablet Take 200 mg by mouth every 6 (six) hours as needed.      Marland Kitchen levothyroxine (SYNTHROID, LEVOTHROID) 100 MCG tablet Take 100 mcg by mouth daily.      Marland Kitchen losartan (COZAAR) 100 MG tablet Take 100 mg by mouth daily.        ALLERGIES:  is allergic to codeine and tetanus toxoids.  REVIEW OF SYSTEMS:  The rest of the 14-point review of system was negative.   Filed Vitals:   02/22/12 1143  BP: 156/76  Pulse: 60  Temp: 97 F (36.1 C)   Wt Readings from Last 3 Encounters:  02/22/12 180 lb 11.2 oz (81.965 kg)  08/26/11 176 lb (79.833 kg)  02/14/11 182 lb (82.555 kg)   ECOG Performance status: 0  PHYSICAL EXAMINATION:   General:  well-nourished woman, in no acute distress.  Eyes:  no scleral icterus.  ENT:  There were no oropharyngeal lesions.  Neck was without thyromegaly.  Lymphatics:  Negative cervical, supraclavicular or axillary adenopathy.  Respiratory: lungs were clear bilaterally without wheezing or crackles.  Cardiovascular:  Regular rate and rhythm, S1/S2, without murmur, rub or gallop.  There was no pedal edema.  GI:  abdomen was soft, flat, nontender, nondistended, without organomegaly.  Muscoloskeletal:  no spinal tenderness of palpation of vertebral spine.  Skin exam was without echymosis, petichae.  Neuro exam was nonfocal.  Patient was able to get  on and off exam table without assistance.  Gait was normal.  Patient was alerted and oriented.  Attention was good.   Language was appropriate.  Mood was normal without depression.  Speech was not pressured.  Thought content was not tangential.  Bilateral breast exam showed bilateral lumpectomy scars that were well healed with some nodular scar formations.  There was no cutaneous abnormality.  There was no skin thickening, nipple discharge, nipple inversion.    LABORATORY/RADIOLOGY DATA:  Lab Results  Component Value Date   WBC 5.6 02/22/2012   HGB 14.1 02/22/2012   HCT 41.4 02/22/2012   PLT 241 02/22/2012   GLUCOSE 171* 08/26/2011   ALKPHOS 100 08/26/2011   ALT 14 08/26/2011   AST 16 08/26/2011   NA 137 08/26/2011   K 4.5 08/26/2011   CL 101 08/26/2011   CREATININE 0.85 08/26/2011   BUN 19 08/26/2011   CO2 24 08/26/2011    Mm Digital Diagnostic Bilat  02/20/2012  *RADIOLOGY REPORT*  Clinical Data:  The patient underwent bilateral lumpectomy for breast cancer with radiation therapy in 2010.  DIGITAL DIAGNOSTIC BILATERAL MAMMOGRAM WITH CAD  Comparison:  02/14/2011, 01/22/2010, 01/30/2009  Findings:  There are scattered fibroglandular densities. Lumpectomy changes are noted bilaterally.  There are benign dystrophic calcifications in each lumpectomy site.  There is no suspicious dominant mass, nonsurgical architectural distortion or calcification to suggest malignancy. Mammographic images were processed with CAD.  IMPRESSION: No mammographic evidence of malignancy.  RECOMMENDATION: Yearly diagnostic mammography is suggested.  BI-RADS CATEGORY 2:  Benign finding(s).  Original Report Authenticated By: Daryl Eastern, M.D.    ASSESSMENT AND PLAN:   1. History of breast cancer:  She has no evidence of recurrent or metastatic disease on clinical history, physical exam, lab test, or mammogram this month.  Since she is 3 years out from the diagnosis; and she is not on hormonal therapy and is doing well, we will see  her back in one year.  2. Surveillance: Her next bilateral mammogram is due in July 2014. Her last DEXA bone density in 03/2011 showed no osteopenia or osteoporosis. As she is not on an AI, no further DEXA scan is indicated from oncology standpoint. Her PCP may elect to repeat DEXA in the future for primary care purposes.  3. Hypothyroidism. She is on levothyroxine per PCP. 4. Hypertension. Slightly elevated systolic blood pressure. She is on losartan per PCP. 5. Age appropriate cancer screening: she is not up to date with colon cancer screening. She still wants to defer this indefinitely against my advise today.  6. Bilateral knee pain:  She is on topical pain med and occasional steroid injection per PCP.  She is looking into acupuncture to see if there is additional relief.    The length of time of the face-to-face encounter was 10 minutes. More than 50% of time was spent counseling and coordination of care.

## 2012-02-23 ENCOUNTER — Telehealth: Payer: Self-pay | Admitting: *Deleted

## 2012-02-23 NOTE — Telephone Encounter (Signed)
Made patient appointment for one year out with the midlevel

## 2013-01-14 ENCOUNTER — Other Ambulatory Visit: Payer: Self-pay | Admitting: Oncology

## 2013-01-14 DIAGNOSIS — Z853 Personal history of malignant neoplasm of breast: Secondary | ICD-10-CM

## 2013-01-14 DIAGNOSIS — Z9889 Other specified postprocedural states: Secondary | ICD-10-CM

## 2013-02-15 ENCOUNTER — Telehealth: Payer: Self-pay | Admitting: Oncology

## 2013-02-15 NOTE — Telephone Encounter (Signed)
pt going to be out of town and needed to r/s...Done °

## 2013-02-20 ENCOUNTER — Ambulatory Visit
Admission: RE | Admit: 2013-02-20 | Discharge: 2013-02-20 | Disposition: A | Discharge status: Home / Self Care | Payer: Medicare Other | Source: Ambulatory Visit | Attending: Oncology | Admitting: Oncology

## 2013-02-20 DIAGNOSIS — Z9889 Other specified postprocedural states: Secondary | ICD-10-CM

## 2013-02-20 DIAGNOSIS — Z853 Personal history of malignant neoplasm of breast: Secondary | ICD-10-CM

## 2013-02-21 ENCOUNTER — Other Ambulatory Visit: Payer: Medicare Other | Admitting: Lab

## 2013-02-21 ENCOUNTER — Ambulatory Visit: Payer: Medicare Other | Admitting: Oncology

## 2013-03-05 ENCOUNTER — Other Ambulatory Visit (HOSPITAL_BASED_OUTPATIENT_CLINIC_OR_DEPARTMENT_OTHER): Payer: Medicare Other | Admitting: Lab

## 2013-03-05 ENCOUNTER — Encounter: Payer: Self-pay | Admitting: Oncology

## 2013-03-05 ENCOUNTER — Ambulatory Visit (HOSPITAL_BASED_OUTPATIENT_CLINIC_OR_DEPARTMENT_OTHER): Payer: Medicare Other | Admitting: Oncology

## 2013-03-05 VITALS — BP 131/76 | HR 79 | Temp 97.2°F | Resp 18 | Ht 67.5 in | Wt 178.2 lb

## 2013-03-05 DIAGNOSIS — Z853 Personal history of malignant neoplasm of breast: Secondary | ICD-10-CM

## 2013-03-05 DIAGNOSIS — C50419 Malignant neoplasm of upper-outer quadrant of unspecified female breast: Secondary | ICD-10-CM

## 2013-03-05 DIAGNOSIS — M25569 Pain in unspecified knee: Secondary | ICD-10-CM

## 2013-03-05 DIAGNOSIS — I10 Essential (primary) hypertension: Secondary | ICD-10-CM

## 2013-03-05 LAB — COMPREHENSIVE METABOLIC PANEL (CC13)
Albumin: 3.7 g/dL (ref 3.5–5.0)
Alkaline Phosphatase: 88 U/L (ref 40–150)
CO2: 23 mEq/L (ref 22–29)
Calcium: 9.8 mg/dL (ref 8.4–10.4)
Chloride: 108 mEq/L (ref 98–109)
Glucose: 86 mg/dl (ref 70–140)
Potassium: 4.7 mEq/L (ref 3.5–5.1)
Sodium: 141 mEq/L (ref 136–145)
Total Protein: 7.2 g/dL (ref 6.4–8.3)

## 2013-03-05 LAB — CBC WITH DIFFERENTIAL/PLATELET
Basophils Absolute: 0.1 10*3/uL (ref 0.0–0.1)
Eosinophils Absolute: 0.2 10*3/uL (ref 0.0–0.5)
HGB: 14.4 g/dL (ref 11.6–15.9)
MONO#: 0.6 10*3/uL (ref 0.1–0.9)
MONO%: 8.2 % (ref 0.0–14.0)
NEUT#: 4.8 10*3/uL (ref 1.5–6.5)
RBC: 4.47 10*6/uL (ref 3.70–5.45)
RDW: 12.9 % (ref 11.2–14.5)
WBC: 7.1 10*3/uL (ref 3.9–10.3)
lymph#: 1.5 10*3/uL (ref 0.9–3.3)

## 2013-03-05 NOTE — Progress Notes (Signed)
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 Her right breast invasive ductal carcinoma measured 1.4 cm; grade 2; status post lumpectomy with the closest margin 0.4 cm from the inferior margin without DCIS, without lymphovascular invasion; ER was 100%, PR 1%, Ki-67 41%, HER-2/neu by CISH was negative at 1.14; 0 out of 3 lymph nodes positive.   PAST THERAPY: Status post bilateral lumpectomy with sentinel lymph node biopsy bilaterally on February 26, 2009. She is s/p MammoSite radiation adjuvantly. She was on adjuvant letrozole between August 2010 and late 2012. She self-discontinued due to myalgia/arthralgia.   CURRENT THERAPY: watchful observation.   INTERVAL HISTORY: Shelby Hood 72 y.o. female returns for regular follow up by herself.  She reports feeling well.  She performs occasional self breast exam; however, she has not found any breast abnormality.  She still has bilateral knee pain which she takes topical pain meds for.   Patient denies fever, anorexia, weight loss, fatigue, headache, visual changes, confusion, drenching night sweats, palpable lymph node swelling, mucositis, odynophagia, dysphagia, nausea vomiting, jaundice, chest pain, palpitation, shortness of breath, dyspnea on exertion, productive cough, gum bleeding, epistaxis, hematemesis, hemoptysis, abdominal pain, abdominal swelling, early satiety, melena, hematochezia, hematuria, skin rash, spontaneous bleeding,heat or cold intolerance, bowel bladder incontinence, back pain, focal motor  weakness, paresthesia, depression, suicidal or homocidal ideation, feeling hopelessness.   Past Medical History  Diagnosis Date  . Breast cancer 01/2009  . Osteoarthritis   . Hypertension   . Hypertriglyceridemia   . Melanoma 2006    stage I; right anterior chest wall; s/p resection   . History of breast cancer 02/22/2012    History reviewed. No pertinent past surgical history.  Current Outpatient Prescriptions  Medication Sig Dispense Refill  . amLODipine (NORVASC) 2.5 MG tablet Take 2.5 mg by mouth daily.      Marland Kitchen gemfibrozil (LOPID) 600 MG tablet Take 600 mg by mouth daily.      Marland Kitchen ibuprofen (ADVIL,MOTRIN) 200 MG tablet Take 200 mg by mouth every 6 (six) hours as needed.      Marland Kitchen levothyroxine (SYNTHROID, LEVOTHROID) 100 MCG tablet Take 100 mcg by mouth daily.      Marland Kitchen losartan (COZAAR) 100 MG tablet Take 100 mg by mouth daily.       No current facility-administered medications for this visit.    ALLERGIES:  is allergic to codeine and tetanus toxoids.  REVIEW OF SYSTEMS:  The rest of the 14-point review of system was negative.   Filed Vitals:   03/05/13 1051  BP: 131/76  Pulse: 79  Temp: 97.2 F (36.2 C)  Resp: 18   Wt Readings from Last 3 Encounters:  03/05/13 178 lb 3.2 oz (80.831 kg)  02/22/12 180 lb 11.2 oz (81.965 kg)  08/26/11 176 lb (79.833 kg)   ECOG Performance status: 0  PHYSICAL EXAMINATION:   General:  well-nourished woman, in no acute distress.  Eyes:  no scleral icterus.  ENT:  There were no oropharyngeal lesions.  Neck was without thyromegaly.  Lymphatics:  Negative cervical, supraclavicular or axillary adenopathy.  Respiratory: lungs were clear bilaterally without wheezing or crackles.  Cardiovascular:  Regular rate and rhythm, S1/S2, without murmur, rub or gallop.  There was no pedal edema.  GI:  abdomen was soft, flat, nontender, nondistended, without organomegaly.  Muscoloskeletal:  no spinal tenderness of palpation of vertebral spine.  Skin exam was  without echymosis, petichae.  Neuro exam was nonfocal.  Patient was able to get on and off exam table without assistance.  Gait was normal.  Patient was alerted and oriented.  Attention was good.   Language was appropriate.  Mood was normal without depression.  Speech was not pressured.  Thought content was not tangential.  Bilateral breast exam showed bilateral lumpectomy scars that were well healed with some nodular scar formations.  There was no cutaneous abnormality.  There was no skin thickening, nipple discharge, nipple inversion.    LABORATORY/RADIOLOGY DATA:  Lab Results  Component Value Date   WBC 7.1 03/05/2013   HGB 14.4 03/05/2013   HCT 41.4 03/05/2013   PLT 290 03/05/2013   GLUCOSE 86 03/05/2013   ALKPHOS 88 03/05/2013   ALT 17 03/05/2013   AST 22 03/05/2013   NA 141 03/05/2013   K 4.7 03/05/2013   CL 106 02/22/2012   CREATININE 0.9 03/05/2013   BUN 15.6 03/05/2013   CO2 23 03/05/2013     ASSESSMENT AND PLAN:   1. History of breast cancer:  She has no evidence of recurrent or metastatic disease on clinical history, physical exam, lab test. Mammogram was negative on 02/20/13. Since she is 4 years out from the diagnosis; and she is not on hormonal therapy and is doing well, we will see her back in one year.  2. Surveillance: Her next bilateral mammogram is due in July 2015. Her last DEXA bone density in 03/2011 showed no osteopenia or osteoporosis. As she is not on an AI, no further DEXA scan is indicated from oncology standpoint. Her PCP may elect to repeat DEXA in the future for primary care purposes.  3. Hypothyroidism. She is on levothyroxine per PCP. 4. Hypertension. She is on amlodipine and losartan per PCP. 5. Age appropriate cancer screening: she is not up to date with colon cancer screening. She still wants to defer this indefinitely against my advice today.  6. Bilateral knee pain:  She is on topical pain med and occasional steroid injection per PCP.  She is looking into  acupuncture to see if there is additional relief.    The length of time of the face-to-face encounter was 15 minutes. More than 50% of time was spent counseling and coordination of care.

## 2013-03-07 ENCOUNTER — Telehealth: Payer: Self-pay | Admitting: Oncology

## 2013-03-07 NOTE — Telephone Encounter (Signed)
lvm for pt regarding to Augh 2015 appt...mailed pt appt sched/avs and letter

## 2013-12-03 ENCOUNTER — Telehealth: Payer: Self-pay | Admitting: *Deleted

## 2013-12-03 ENCOUNTER — Telehealth: Payer: Self-pay | Admitting: Hematology and Oncology

## 2013-12-03 NOTE — Telephone Encounter (Signed)
lmonvm for pt re appt for 5/27 @ 11:30am. called cell and was able to s/w pt re appt for 5/27.

## 2013-12-03 NOTE — Telephone Encounter (Signed)
Pt called to say that she wants to be seen by Dr Alvy Bimler ASAP. She states her husband died from pancreatic cancer and she feels like she is having the same symptoms. "not feeling well, trouble eating and a hard area in my abdomen". States she has not been to her PCP. "What's he going to do, just send me back to you". Pt was angry and does not want to go to PCP. I told her we will get her in to see Dr Alvy Bimler ASAP. Pt states she does not want to be "put off".

## 2013-12-18 ENCOUNTER — Ambulatory Visit (HOSPITAL_BASED_OUTPATIENT_CLINIC_OR_DEPARTMENT_OTHER): Payer: Medicare Other | Admitting: Hematology and Oncology

## 2013-12-18 ENCOUNTER — Telehealth: Payer: Self-pay | Admitting: Hematology and Oncology

## 2013-12-18 ENCOUNTER — Encounter: Payer: Self-pay | Admitting: Hematology and Oncology

## 2013-12-18 ENCOUNTER — Ambulatory Visit (HOSPITAL_BASED_OUTPATIENT_CLINIC_OR_DEPARTMENT_OTHER): Payer: Medicare Other

## 2013-12-18 VITALS — BP 136/61 | HR 80 | Temp 98.2°F | Resp 18 | Ht 67.5 in | Wt 167.0 lb

## 2013-12-18 DIAGNOSIS — C50419 Malignant neoplasm of upper-outer quadrant of unspecified female breast: Secondary | ICD-10-CM

## 2013-12-18 DIAGNOSIS — R634 Abnormal weight loss: Secondary | ICD-10-CM

## 2013-12-18 DIAGNOSIS — E039 Hypothyroidism, unspecified: Secondary | ICD-10-CM

## 2013-12-18 DIAGNOSIS — Z853 Personal history of malignant neoplasm of breast: Secondary | ICD-10-CM

## 2013-12-18 LAB — CBC WITH DIFFERENTIAL/PLATELET
BASO%: 0.9 % (ref 0.0–2.0)
BASOS ABS: 0.1 10*3/uL (ref 0.0–0.1)
EOS ABS: 0.3 10*3/uL (ref 0.0–0.5)
EOS%: 3.6 % (ref 0.0–7.0)
HCT: 40.3 % (ref 34.8–46.6)
HEMOGLOBIN: 13.1 g/dL (ref 11.6–15.9)
LYMPH%: 12 % — AB (ref 14.0–49.7)
MCH: 30.2 pg (ref 25.1–34.0)
MCHC: 32.6 g/dL (ref 31.5–36.0)
MCV: 92.4 fL (ref 79.5–101.0)
MONO#: 0.6 10*3/uL (ref 0.1–0.9)
MONO%: 7.4 % (ref 0.0–14.0)
NEUT%: 76.1 % (ref 38.4–76.8)
NEUTROS ABS: 6.4 10*3/uL (ref 1.5–6.5)
PLATELETS: 441 10*3/uL — AB (ref 145–400)
RBC: 4.35 10*6/uL (ref 3.70–5.45)
RDW: 12.9 % (ref 11.2–14.5)
WBC: 8.5 10*3/uL (ref 3.9–10.3)
lymph#: 1 10*3/uL (ref 0.9–3.3)

## 2013-12-18 LAB — COMPREHENSIVE METABOLIC PANEL (CC13)
ALT: 49 U/L (ref 0–55)
AST: 82 U/L — ABNORMAL HIGH (ref 5–34)
Albumin: 3.5 g/dL (ref 3.5–5.0)
Alkaline Phosphatase: 108 U/L (ref 40–150)
Anion Gap: 13 mEq/L — ABNORMAL HIGH (ref 3–11)
BUN: 14.2 mg/dL (ref 7.0–26.0)
CO2: 23 mEq/L (ref 22–29)
Calcium: 9.6 mg/dL (ref 8.4–10.4)
Chloride: 103 mEq/L (ref 98–109)
Creatinine: 0.8 mg/dL (ref 0.6–1.1)
GLUCOSE: 87 mg/dL (ref 70–140)
Potassium: 4.3 mEq/L (ref 3.5–5.1)
SODIUM: 138 meq/L (ref 136–145)
Total Bilirubin: 0.78 mg/dL (ref 0.20–1.20)
Total Protein: 6.8 g/dL (ref 6.4–8.3)

## 2013-12-18 LAB — TSH CHCC: TSH: 3.037 m(IU)/L (ref 0.308–3.960)

## 2013-12-18 NOTE — Telephone Encounter (Signed)
pt sent back to lab and given appt schedule for june/aug. central will call re ct appt - pt aware.

## 2013-12-18 NOTE — Progress Notes (Signed)
[facility] FOLLOW-UP progress notes  Patient Care Team: Hulan Fess, MD as PCP - General (Family Medicine) Haywood Lasso, MD (General Surgery)  CHIEF COMPLAINTS/PURPOSE OF VISIT:  Abnormal weight loss, palpable lump in the right lower quadrant, on background history of bilateral breast cancer  HISTORY OF PRESENTING ILLNESS:  Shelby Hood 73 y.o. female was transferred to my care after her prior physician has left.  I reviewed the patient's records extensive and collaborated the history with the patient. Summary of her history is as follows: She was diagnosed with bilateral invasive ductal carcinoma with the left breast measuring 1.4 cm; grade 2; status post lumpectomy on February 26, 2009 with negative margins; the closest margin with 0.5 cm from the anterior margin without DCIS, without lymphovascular invasion, 0 out of 3 lymph nodes positive, ER was 89%, PR 89%, Ki-67 12%, HER-2/neu by CISH was 1.03 or negative. Her right breast invasive ductal carcinoma measured 1.4 cm; grade 2; status post lumpectomy with the closest margin 0.4 cm from the inferior margin without DCIS, without lymphovascular invasion; ER was 100%, PR 1%, Ki-67 41%, HER-2/neu by CISH was negative at 1.14; 0 out of 3 lymph nodes positive. She is s/p MammoSite radiation adjuvantly. She was on adjuvant letrozole between August 2010 and late 2012. She self-discontinued due to myalgia/arthralgia.  Due to bilateral breast cancer, apparently she visit with genetic counseling and testing negative for inheritable breast/ovarian disease syndrome.  She denies any recent abnormal breast examination, palpable mass, abnormal breast appearance or nipple changes. Her last mammogram was in 2014 and was negative for recurrence. The reason why she needs to be seen early now is because she has unexplained weight loss of approximately 10 pounds over the past year. She complained of new onset of fatigue and reduced appetite over  the last 6 weeks. She also complained of some abnormal feeling in sensation in the right lower quadrant. She denies new lymphadenopathy. She denies change in bowel habits. Denies any pain in her abdomen.  MEDICAL HISTORY:  Past Medical History  Diagnosis Date  . Breast cancer 01/2009  . Osteoarthritis   . Hypertension   . Hypertriglyceridemia   . Melanoma 2006    stage I; right anterior chest wall; s/p resection   . History of breast cancer 02/22/2012    SURGICAL HISTORY: History reviewed. No pertinent past surgical history.  SOCIAL HISTORY: History   Social History  . Marital Status: Widowed    Spouse Name: N/A    Number of Children: N/A  . Years of Education: N/A   Occupational History  . Not on file.   Social History Main Topics  . Smoking status: Never Smoker   . Smokeless tobacco: Never Used  . Alcohol Use: 4.2 oz/week    7 Glasses of wine per week  . Drug Use: No  . Sexual Activity: Not on file   Other Topics Concern  . Not on file   Social History Narrative  . No narrative on file    FAMILY HISTORY: Family History  Problem Relation Age of Onset  . Cancer Mother     breast ca  . Cancer Brother     ?lung ca    ALLERGIES:  is allergic to codeine and tetanus toxoids.  MEDICATIONS:  Current Outpatient Prescriptions  Medication Sig Dispense Refill  . amLODipine (NORVASC) 2.5 MG tablet Take 2.5 mg by mouth daily.      Marland Kitchen gemfibrozil (LOPID) 600 MG tablet Take 600 mg by  mouth daily.      Marland Kitchen ibuprofen (ADVIL,MOTRIN) 200 MG tablet Take 200 mg by mouth every 6 (six) hours as needed.      Marland Kitchen levothyroxine (SYNTHROID, LEVOTHROID) 100 MCG tablet Take 100 mcg by mouth daily.      Marland Kitchen loratadine (CLARITIN) 10 MG tablet Take 10 mg by mouth daily.      Marland Kitchen losartan (COZAAR) 100 MG tablet Take 100 mg by mouth daily.       No current facility-administered medications for this visit.    REVIEW OF SYSTEMS:   Constitutional: Denies fevers, chills or abnormal night  sweats Eyes: Denies blurriness of vision, double vision or watery eyes Ears, nose, mouth, throat, and face: Denies mucositis or sore throat Respiratory: Denies cough, dyspnea or wheezes Cardiovascular: Denies palpitation, chest discomfort or lower extremity swelling Gastrointestinal:  Denies nausea, heartburn or change in bowel habits Skin: Denies abnormal skin rashes Lymphatics: Denies new lymphadenopathy or easy bruising Neurological:Denies numbness, tingling or new weaknesses Behavioral/Psych: Mood is stable, no new changes  All other systems were reviewed with the patient and are negative.  PHYSICAL EXAMINATION: ECOG PERFORMANCE STATUS: 1 - Symptomatic but completely ambulatory  Filed Vitals:   12/18/13 1125  BP: 136/61  Pulse: 80  Temp: 98.2 F (36.8 C)  Resp: 18   Filed Weights   12/18/13 1125  Weight: 167 lb (75.751 kg)    GENERAL:alert, no distress and comfortable. She is obese SKIN: skin color, texture, turgor are normal, no rashes or significant lesions EYES: normal, conjunctiva are pink and non-injected, sclera clear OROPHARYNX:no exudate, normal lips, buccal mucosa, and tongue  NECK: supple, thyroid normal size, non-tender, without nodularity LYMPH:  no palpable lymphadenopathy in the cervical, axillary or inguinal LUNGS: clear to auscultation and percussion with normal breathing effort HEART: regular rate & rhythm and no murmurs without lower extremity edema ABDOMEN:abdomen soft, non-tender and normal bowel sounds. There is a palpable fullness in the right lower quadrant of unknown etiology Musculoskeletal:no cyanosis of digits and no clubbing  PSYCH: alert & oriented x 3 with fluent speech NEURO: no focal motor/sensory deficits Bilateral breast examination reveals lumps over the lumpectomy scars, which according to the patient is normal for her at baseline. No other abnormalities noted. LABORATORY DATA:  I have reviewed the data as listed Lab Results   Component Value Date   WBC 8.5 12/18/2013   HGB 13.1 12/18/2013   HCT 40.3 12/18/2013   MCV 92.4 12/18/2013   PLT 441* 12/18/2013    Recent Labs  03/05/13 1042 12/18/13 1215  NA 141 138  K 4.7 4.3  CO2 23 23  GLUCOSE 86 87  BUN 15.6 14.2  CREATININE 0.9 0.8  CALCIUM 9.8 9.6  PROT 7.2 6.8  ALBUMIN 3.7 3.5  AST 22 82*  ALT 17 49  ALKPHOS 88 108  BILITOT 0.55 0.78   ASSESSMENT & PLAN:  #1 history of bilateral breast cancer Clinically, there is nothing to suggest local disease reoccurrence. I will order and tumor markers just to be sure due to her high risk situation and unexplained weight loss #2 palpable fullness in her abdomen I am concerned about this. I will order CA 125 to exclude potential gynecology malignancy. I will proceed to order a CT scan of her abdomen and pelvis in view of recent weight loss. #3 unexplained weight loss #4 fatigue I will her thyroid function tests. The patient has a trip planned in the near future. I will see her in approximately 3 weeks  to review all test results. Orders Placed This Encounter  Procedures  . CT Abdomen Pelvis W Contrast    Standing Status: Future     Number of Occurrences:      Standing Expiration Date: 03/20/2015    Order Specific Question:  Reason for Exam (SYMPTOM  OR DIAGNOSIS REQUIRED)    Answer:  fullness in abdomen, Hx bilateral breast cancer, weight loss    Order Specific Question:  Preferred imaging location?    Answer:  [facility]  . Comprehensive metabolic panel    Standing Status: Future     Number of Occurrences: 1     Standing Expiration Date: 12/18/2014  . CBC with Differential    Standing Status: Future     Number of Occurrences: 1     Standing Expiration Date: 12/18/2014  . Cancer antigen 27.29    Standing Status: Future     Number of Occurrences: 1     Standing Expiration Date: 12/18/2014  . CA 125    Standing Status: Future     Number of Occurrences: 1     Standing Expiration Date:  12/18/2014  . T4, free    Standing Status: Future     Number of Occurrences: 1     Standing Expiration Date: 12/18/2014  . TSH    Standing Status: Future     Number of Occurrences: 1     Standing Expiration Date: 12/18/2014    All questions were answered. The patient knows to call the clinic with any problems, questions or concerns.    Heath Lark, MD 12/18/2013 9:07 PM

## 2013-12-19 ENCOUNTER — Telehealth: Payer: Self-pay | Admitting: *Deleted

## 2013-12-19 LAB — CANCER ANTIGEN 27.29: CA 27.29: 4 U/mL (ref 0–39)

## 2013-12-19 LAB — T4, FREE: FREE T4: 1.21 ng/dL (ref 0.80–1.80)

## 2013-12-19 LAB — CA 125: CA 125: 2.7 U/mL (ref 0.0–30.2)

## 2013-12-19 NOTE — Telephone Encounter (Signed)
Pt called to ask if her CT scan can be scheduled before she goes out of town this weekend?  Informed pt it is scheduled for 6/16 but to call Radiology Scheduler to confirm and for any other instructions.  Gave her their phone number. She verbalized understanding.

## 2013-12-19 NOTE — Telephone Encounter (Signed)
Message copied by Cathlean Cower on Thu Dec 19, 2013  9:21 AM ------      Message from: [facility], [place]: Thu Dec 19, 2013  8:14 AM      Regarding: blood test result       Overall all tests unremarkable apart from mild elevated AST, unknown significance      Recommend proceed with Ct as discussed      Please call her and let her know test results      ----- Message -----         From: [facility]         Sent: 12/18/2013  12:33 PM           To: Heath Lark, MD                   ------

## 2013-12-19 NOTE — Telephone Encounter (Signed)
Informed pt of Dr. Calton Dach message below and to proceed w/ CT scan as scheduled. She verbalized understanding.

## 2014-01-07 ENCOUNTER — Ambulatory Visit (HOSPITAL_COMMUNITY)
Admission: RE | Admit: 2014-01-07 | Discharge: 2014-01-07 | Disposition: A | Discharge status: Home / Self Care | Payer: Medicare Other | Source: Ambulatory Visit | Attending: Hematology and Oncology | Admitting: Hematology and Oncology

## 2014-01-07 ENCOUNTER — Encounter (HOSPITAL_COMMUNITY): Payer: Self-pay

## 2014-01-07 DIAGNOSIS — K7689 Other specified diseases of liver: Secondary | ICD-10-CM | POA: Insufficient documentation

## 2014-01-07 DIAGNOSIS — R1031 Right lower quadrant pain: Secondary | ICD-10-CM | POA: Insufficient documentation

## 2014-01-07 DIAGNOSIS — I7090 Unspecified atherosclerosis: Secondary | ICD-10-CM | POA: Insufficient documentation

## 2014-01-07 DIAGNOSIS — R634 Abnormal weight loss: Secondary | ICD-10-CM | POA: Insufficient documentation

## 2014-01-07 DIAGNOSIS — Z853 Personal history of malignant neoplasm of breast: Secondary | ICD-10-CM | POA: Insufficient documentation

## 2014-01-07 DIAGNOSIS — J8410 Pulmonary fibrosis, unspecified: Secondary | ICD-10-CM | POA: Insufficient documentation

## 2014-01-07 DIAGNOSIS — D259 Leiomyoma of uterus, unspecified: Secondary | ICD-10-CM | POA: Insufficient documentation

## 2014-01-07 MED ORDER — IOHEXOL 300 MG/ML  SOLN
100.0000 mL | Freq: Once | INTRAMUSCULAR | Status: AC | PRN
  Administered 2014-01-07: 100 mL via INTRAVENOUS

## 2014-01-08 ENCOUNTER — Telehealth: Payer: Self-pay | Admitting: Hematology and Oncology

## 2014-01-08 NOTE — Telephone Encounter (Signed)
Left patient a voice mail. CT scan abdomen show large tumor mass in her liver. Biopsy is recommended but I am not able to get hold her.

## 2014-01-09 ENCOUNTER — Ambulatory Visit (HOSPITAL_BASED_OUTPATIENT_CLINIC_OR_DEPARTMENT_OTHER): Payer: Medicare Other

## 2014-01-09 ENCOUNTER — Encounter: Payer: Self-pay | Admitting: Hematology and Oncology

## 2014-01-09 ENCOUNTER — Ambulatory Visit (HOSPITAL_BASED_OUTPATIENT_CLINIC_OR_DEPARTMENT_OTHER): Payer: Medicare Other | Admitting: Hematology and Oncology

## 2014-01-09 ENCOUNTER — Telehealth: Payer: Self-pay | Admitting: Hematology and Oncology

## 2014-01-09 VITALS — BP 141/70 | HR 85 | Temp 97.9°F | Resp 18 | Ht 67.5 in | Wt 167.1 lb

## 2014-01-09 DIAGNOSIS — Z85820 Personal history of malignant melanoma of skin: Secondary | ICD-10-CM

## 2014-01-09 DIAGNOSIS — R160 Hepatomegaly, not elsewhere classified: Secondary | ICD-10-CM

## 2014-01-09 DIAGNOSIS — C801 Malignant (primary) neoplasm, unspecified: Secondary | ICD-10-CM

## 2014-01-09 DIAGNOSIS — Z853 Personal history of malignant neoplasm of breast: Secondary | ICD-10-CM

## 2014-01-09 DIAGNOSIS — K769 Liver disease, unspecified: Secondary | ICD-10-CM

## 2014-01-09 NOTE — Progress Notes (Signed)
[facility] OFFICE PROGRESS NOTE  Patient Care Team: Hulan Fess, MD as PCP - General (Family Medicine) Haywood Lasso, MD (General Surgery)  SUMMARY OF ONCOLOGIC HISTORY: She was diagnosed with bilateral invasive ductal carcinoma with the left breast measuring 1.4 cm; grade 2; status post lumpectomy on February 26, 2009 with negative margins; the closest margin with 0.5 cm from the anterior margin without DCIS, without lymphovascular invasion, 0 out of 3 lymph nodes positive, ER was 89%, PR 89%, Ki-67 12%, HER-2/neu by CISH was 1.03 or negative. Her right breast invasive ductal carcinoma measured 1.4 cm; grade 2; status post lumpectomy with the closest margin 0.4 cm from the inferior margin without DCIS, without lymphovascular invasion; ER was 100%, PR 1%, Ki-67 41%, HER-2/neu by CISH was negative at 1.14; 0 out of 3 lymph nodes positive. She is s/p MammoSite radiation adjuvantly. She was on adjuvant letrozole between August 2010 and late 2012. She self-discontinued due to myalgia/arthralgia.  Due to bilateral breast cancer, apparently she visit with genetic counseling and testing negative for inheritable breast/ovarian disease syndrome. In May 2015, she presented with weight loss and a palpable abdominal mass. CT scan show a large mass in her liver.  INTERVAL HISTORY: Please see below for problem oriented charting. She denies significant pain. She is to have anorexia.  REVIEW OF SYSTEMS:   Eyes: Denies blurriness of visionEars, nose, mouth, throat, and face: Denies mucositis or sore throat Respiratory: Denies cough, dyspnea or wheezes Cardiovascular: Denies palpitation, chest discomfort or lower extremity swelling Gastrointestinal:  Denies nausea, heartburn or change in bowel habits Skin: Denies abnormal skin rashes Lymphatics: Denies new lymphadenopathy or easy bruising Neurological:Denies numbness, tingling or new weaknesses Behavioral/Psych: Mood is stable, no new changes   All other systems were reviewed with the patient and are negative.  I have reviewed the past medical history, past surgical history, social history and family history with the patient and they are unchanged from previous note.  ALLERGIES:  is allergic to codeine and tetanus toxoids.  MEDICATIONS:  Current Outpatient Prescriptions  Medication Sig Dispense Refill  . amLODipine (NORVASC) 2.5 MG tablet Take 2.5 mg by mouth daily.      Marland Kitchen gemfibrozil (LOPID) 600 MG tablet Take 600 mg by mouth daily.      Marland Kitchen ibuprofen (ADVIL,MOTRIN) 200 MG tablet Take 200 mg by mouth every 6 (six) hours as needed.      Marland Kitchen levothyroxine (SYNTHROID, LEVOTHROID) 100 MCG tablet Take 100 mcg by mouth daily.      Marland Kitchen loratadine (CLARITIN) 10 MG tablet Take 10 mg by mouth daily.      Marland Kitchen losartan (COZAAR) 100 MG tablet Take 100 mg by mouth daily.       No current facility-administered medications for this visit.    PHYSICAL EXAMINATION: ECOG PERFORMANCE STATUS: 1 - Symptomatic but completely ambulatory  Filed Vitals:   01/09/14 1402  BP: 141/70  Pulse: 85  Temp: 97.9 F (36.6 C)  Resp: 18   Filed Weights   01/09/14 1402  Weight: 167 lb 1.6 oz (75.796 kg)    GENERAL:alert, no distress and comfortable SKIN: skin color, texture, turgor are normal, no rashes or significant lesions EYES: normal, Conjunctiva are pink and non-injected, sclera clear Musculoskeletal:no cyanosis of digits and no clubbing  NEURO: alert & oriented x 3 with fluent speech, no focal motor/sensory deficits  LABORATORY DATA:  I have reviewed the data as listed    Component Value Date/Time   NA 138 12/18/2013 1215  NA 143 02/22/2012 1127   K 4.3 12/18/2013 1215   K 5.0 02/22/2012 1127   CL 106 02/22/2012 1127   CO2 23 12/18/2013 1215   CO2 25 02/22/2012 1127   GLUCOSE 87 12/18/2013 1215   GLUCOSE 89 02/22/2012 1127   BUN 14.2 12/18/2013 1215   BUN 16 02/22/2012 1127   CREATININE 0.8 12/18/2013 1215   CREATININE 1.02 02/22/2012 1127    CALCIUM 9.6 12/18/2013 1215   CALCIUM 10.1 02/22/2012 1127   PROT 6.8 12/18/2013 1215   PROT 6.8 02/22/2012 1127   ALBUMIN 3.5 12/18/2013 1215   ALBUMIN 4.7 02/22/2012 1127   AST 82* 12/18/2013 1215   AST 19 02/22/2012 1127   ALT 49 12/18/2013 1215   ALT 13 02/22/2012 1127   ALKPHOS 108 12/18/2013 1215   ALKPHOS 82 02/22/2012 1127   BILITOT 0.78 12/18/2013 1215   BILITOT 0.7 02/22/2012 1127   GFRNONAA >60 03/12/2009 0924   GFRAA  Value: >60        The eGFR has been calculated using the MDRD equation. This calculation has not been validated in all clinical situations. eGFR's persistently <60 mL/min signify possible Chronic Kidney Disease. 03/12/2009 0924    No results found for this basename: SPEP,  UPEP,   kappa and lambda light chains    Lab Results  Component Value Date   WBC 8.5 12/18/2013   NEUTROABS 6.4 12/18/2013   HGB 13.1 12/18/2013   HCT 40.3 12/18/2013   MCV 92.4 12/18/2013   PLT 441* 12/18/2013      Chemistry      Component Value Date/Time   NA 138 12/18/2013 1215   NA 143 02/22/2012 1127   K 4.3 12/18/2013 1215   K 5.0 02/22/2012 1127   CL 106 02/22/2012 1127   CO2 23 12/18/2013 1215   CO2 25 02/22/2012 1127   BUN 14.2 12/18/2013 1215   BUN 16 02/22/2012 1127   CREATININE 0.8 12/18/2013 1215   CREATININE 1.02 02/22/2012 1127      Component Value Date/Time   CALCIUM 9.6 12/18/2013 1215   CALCIUM 10.1 02/22/2012 1127   ALKPHOS 108 12/18/2013 1215   ALKPHOS 82 02/22/2012 1127   AST 82* 12/18/2013 1215   AST 19 02/22/2012 1127   ALT 49 12/18/2013 1215   ALT 13 02/22/2012 1127   BILITOT 0.78 12/18/2013 1215   BILITOT 0.7 02/22/2012 1127      RADIOGRAPHIC STUDIES: I reviewed the CT scan with the patient I have personally reviewed the radiological images as listed and agreed with the findings in the report.  ASSESSMENT & PLAN:  History of breast cancer Clinically, she has no evidence of disease recurrence. However, the liver mass could be from metastatic breast cancer. I will order  additional tumor markers.  Liver mass, right lobe I have discussed this with the surgeon. I will not want to attempt CT-guided liver biopsy for fear it can cause bleeding as it appears hypervascular on the CT scan. The patient and I are agreement to pursue a PET/CT scan for further evaluation. I have referred to General Surgery for further management.  History of melanoma The patient had history of melanoma/skin cancer. I will order a PET/CT scan for full staging as this could potentially be the source of liver mass.    Orders Placed This Encounter  Procedures  . NM PET Image Initial (PI) Whole Body    Standing Status: Future     Number of Occurrences:  Standing Expiration Date: 03/11/2015    Order Specific Question:  Reason for Exam (SYMPTOM  OR DIAGNOSIS REQUIRED)    Answer:  staging liver mass, hx breast cancer and melanoma,    Order Specific Question:  Preferred imaging location?    Answer:  [facility]  . APTT    Standing Status: Future     Number of Occurrences: 1     Standing Expiration Date: 01/09/2015  . Protime-INR    Standing Status: Future     Number of Occurrences: 1     Standing Expiration Date: 01/09/2015  . CEA    Standing Status: Future     Number of Occurrences: 1     Standing Expiration Date: 01/09/2015  . AFP tumor marker    Standing Status: Future     Number of Occurrences: 1     Standing Expiration Date: 01/09/2015  . Cancer antigen 19-9    Standing Status: Future     Number of Occurrences: 1     Standing Expiration Date: 01/09/2015  . Ambulatory referral to General Surgery    Referral Priority:  Routine    Referral Type:  Surgical    Referral Reason:  Specialty Services Required    Requested Specialty:  General Surgery    Number of Visits Requested:  1   All questions were answered. The patient knows to call the clinic with any problems, questions or concerns. No barriers to learning was detected.    [facility], NI, MD 01/09/2014 8:43  PM

## 2014-01-09 NOTE — Telephone Encounter (Signed)
Gave pt appt for MD on july and pt will see Dr Barry Dienes , MD aware of appt date

## 2014-01-09 NOTE — Assessment & Plan Note (Signed)
Clinically, she has no evidence of disease recurrence. However, the liver mass could be from metastatic breast cancer. I will order additional tumor markers.

## 2014-01-09 NOTE — Assessment & Plan Note (Signed)
The patient had history of melanoma/skin cancer. I will order a PET/CT scan for full staging as this could potentially be the source of liver mass.

## 2014-01-09 NOTE — Assessment & Plan Note (Signed)
I have discussed this with the surgeon. I will not want to attempt CT-guided liver biopsy for fear it can cause bleeding as it appears hypervascular on the CT scan. The patient and I are agreement to pursue a PET/CT scan for further evaluation. I have referred to General Surgery for further management.

## 2014-01-10 LAB — APTT: aPTT: 31 seconds (ref 24–37)

## 2014-01-10 LAB — CEA: CEA: 1.8 ng/mL (ref 0.0–5.0)

## 2014-01-10 LAB — AFP TUMOR MARKER: AFP TUMOR MARKER: 25.7 ng/mL — AB (ref 0.0–8.0)

## 2014-01-10 LAB — PROTHROMBIN TIME
INR: 1 (ref <1.50)
PROTHROMBIN TIME: 13.1 s (ref 11.6–15.2)

## 2014-01-10 LAB — CANCER ANTIGEN 19-9: CA 19-9: 7 U/mL (ref <35.0)

## 2014-01-14 ENCOUNTER — Telehealth: Payer: Self-pay | Admitting: *Deleted

## 2014-01-14 NOTE — Telephone Encounter (Signed)
Faxed office notes to Dr Crisoforo Oxford at [facility] per patient's request

## 2014-01-15 ENCOUNTER — Ambulatory Visit (HOSPITAL_COMMUNITY)
Admission: RE | Admit: 2014-01-15 | Discharge: 2014-01-15 | Disposition: A | Discharge status: Home / Self Care | Payer: Medicare Other | Source: Ambulatory Visit | Attending: Hematology and Oncology | Admitting: Hematology and Oncology

## 2014-01-15 DIAGNOSIS — R160 Hepatomegaly, not elsewhere classified: Secondary | ICD-10-CM

## 2014-01-15 DIAGNOSIS — C50919 Malignant neoplasm of unspecified site of unspecified female breast: Secondary | ICD-10-CM | POA: Insufficient documentation

## 2014-01-15 DIAGNOSIS — Z85820 Personal history of malignant melanoma of skin: Secondary | ICD-10-CM | POA: Insufficient documentation

## 2014-01-15 DIAGNOSIS — C801 Malignant (primary) neoplasm, unspecified: Secondary | ICD-10-CM

## 2014-01-15 DIAGNOSIS — K7689 Other specified diseases of liver: Secondary | ICD-10-CM | POA: Insufficient documentation

## 2014-01-15 LAB — GLUCOSE, CAPILLARY: Glucose-Capillary: 94 mg/dL (ref 70–99)

## 2014-01-15 MED ORDER — FLUDEOXYGLUCOSE F - 18 (FDG) INJECTION
9.4000 | Freq: Once | INTRAVENOUS | Status: AC | PRN
Start: 2014-01-15 — End: 2014-01-15
  Administered 2014-01-15: 9.4 via INTRAVENOUS

## 2014-01-16 ENCOUNTER — Telehealth: Payer: Self-pay | Admitting: Hematology and Oncology

## 2014-01-16 NOTE — Telephone Encounter (Signed)
Attempted to call several times, unable to get hold of patient. The daughter's number listed was incorrect

## 2014-01-17 ENCOUNTER — Ambulatory Visit (INDEPENDENT_AMBULATORY_CARE_PROVIDER_SITE_OTHER): Payer: Medicare Other | Admitting: General Surgery

## 2014-01-17 ENCOUNTER — Other Ambulatory Visit (INDEPENDENT_AMBULATORY_CARE_PROVIDER_SITE_OTHER): Payer: Self-pay | Admitting: General Surgery

## 2014-01-17 ENCOUNTER — Encounter (INDEPENDENT_AMBULATORY_CARE_PROVIDER_SITE_OTHER): Payer: Self-pay | Admitting: General Surgery

## 2014-01-17 ENCOUNTER — Encounter (HOSPITAL_COMMUNITY): Payer: Self-pay | Admitting: Pharmacy Technician

## 2014-01-17 ENCOUNTER — Other Ambulatory Visit: Payer: Self-pay | Admitting: Radiology

## 2014-01-17 VITALS — BP 124/60 | HR 80 | Temp 98.0°F | Resp 14 | Ht 68.0 in | Wt 164.4 lb

## 2014-01-17 DIAGNOSIS — C228 Malignant neoplasm of liver, primary, unspecified as to type: Secondary | ICD-10-CM | POA: Insufficient documentation

## 2014-01-17 NOTE — Assessment & Plan Note (Signed)
I am concerned that this may be a hepatocellular cancer.  Her AST is slightly elevated and her PET scan is negative for any suspicious lesions anywhere else.  This is unlikely to be breast or melanoma as both of those diagnoses were stage I when they were removed.  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  This would provide hopefully some shrinkage in the right side of the liver and some enlargement in the left side of the liver. I also think this minimizes her risk for blood loss during surgery.  I do agree that a biopsy of the lower right part of the tumor would be very risky for hemorrhage, but in looking at her CT scan carefully with radiology, there are some higher portions that are intrahepatic that would be amenable to ultrasound-guided biopsy. I think having a diagnosis could also be extremely helpful.  I discussed this with interventional radiology. We are going to get these 2 things set up.  I will also set up a CT scan 4 weeks after embolization. I would like to see her back after the CT scan and before surgery. I will also set up surgery for around 5 weeks after embolization.  We briefly discussed surgery.

## 2014-01-17 NOTE — Progress Notes (Signed)
Chief Complaint  Patient presents with  . Other    new pt- eval liver mass    HISTORY:  Patient is a 73 year old female referred by Dr. Alvy Bimler for evaluation of a large right liver mass.  She had stage I breast cancer in 2010 and was scheduled to have a followup visit in August of 2015. She moved  this up when she noticed that she was losing weight and feeling very weak.  She also has some fullness in her right abdomen. She was noted to have an extremely low liver edge. A CT scan of the abdomen and pelvis demonstrated an extremely large liver mass with significant necrosis and shunting. The mass was extremely hypervascular.  She describes her appetite has been very low. She continues to lose weight. She tries to eat, but feels nauseated if she eats too much. She denies any abdominal pain or fevers. She has not had any significant change in her bowel habits.  Past Medical History  Diagnosis Date  . Osteoarthritis   . Hypertension   . Hypertriglyceridemia   . History of breast cancer 02/22/2012  . Breast cancer 01/2009  . Melanoma 2006    stage I; right anterior chest wall; s/p resection     Past Surgical History  Procedure Laterality Date  . Skin biopsy    . Ectopic pregnancy surgery    . Tubal ligation    . Breast lumpectomy      bilateral  . Melanoma excision      Current Outpatient Prescriptions  Medication Sig Dispense Refill  . amLODipine (NORVASC) 2.5 MG tablet Take 2.5 mg by mouth daily.      Marland Kitchen gemfibrozil (LOPID) 600 MG tablet Take 600 mg by mouth daily.      Marland Kitchen ibuprofen (ADVIL,MOTRIN) 200 MG tablet Take 200 mg by mouth every 6 (six) hours as needed.      Marland Kitchen levothyroxine (SYNTHROID, LEVOTHROID) 100 MCG tablet Take 100 mcg by mouth daily.      Marland Kitchen loratadine (CLARITIN) 10 MG tablet Take 10 mg by mouth daily.      Marland Kitchen losartan (COZAAR) 100 MG tablet Take 100 mg by mouth daily.       No current facility-administered medications for this visit.     Allergies  Allergen  Reactions  . Codeine   . Tetanus Toxoids      Family History  Problem Relation Age of Onset  . Cancer Mother     breast ca  . Cancer Brother     ?lung ca     History   Social History  . Marital Status: Widowed    Spouse Name: N/A    Number of Children: N/A  . Years of Education: N/A   Social History Main Topics  . Smoking status: Former Smoker    Quit date: 07/25/1966  . Smokeless tobacco: Never Used  . Alcohol Use: 4.2 oz/week    7 Glasses of wine per week  . Drug Use: No  . Sexual Activity: None   Other Topics Concern  . None   Social History Narrative  . None     REVIEW OF SYSTEMS - PERTINENT POSITIVES ONLY: 12 point review of systems negative other than HPI and PMH  EXAM: Filed Vitals:   01/17/14 0949  BP: 124/60  Pulse: 80  Temp: 98 F (36.7 C)  Resp: 14    Wt Readings from Last 3 Encounters:  01/17/14 164 lb 6.4 oz (74.571 kg)  01/09/14 167 lb 1.6  oz (75.796 kg)  12/18/13 167 lb (75.751 kg)     Gen:  No acute distress.  Well nourished and well groomed.   Neurological: Alert and oriented to person, place, and time. Coordination normal.  Head: Normocephalic and atraumatic.  Eyes: Conjunctivae are normal. Pupils are equal, round, and reactive to light. No scleral icterus.  Neck: Normal range of motion. Neck supple. No tracheal deviation or thyromegaly present.  Cardiovascular: Normal rate, regular rhythm, normal heart sounds and intact distal pulses.  Exam reveals no gallop and no friction rub.  No murmur heard. Respiratory: Effort normal.  No respiratory distress. No chest wall tenderness. Breath sounds normal.  No wheezes, rales or rhonchi.  GI: Soft. Bowel sounds are normal. The abdomen is soft and nontender.  There is no rebound and no guarding. There is fullness and hepatomegaly.  The liver edge is palpable all the way below the level of the ASIS.  This is firm.  There is no splenomegaly.   Musculoskeletal: Normal range of motion.  Extremities are nontender.  Lymphadenopathy: No cervical, preauricular, postauricular or axillary adenopathy is present Skin: Skin is warm and dry. No rash noted. No diaphoresis. No erythema. No pallor. No clubbing, cyanosis, or edema.   Psychiatric: Normal mood and affect. Behavior is normal. Judgment and thought content normal.    LABORATORY RESULTS: Available labs are reviewed   Recent Results (from the past 2160 hour(s))  COMPREHENSIVE METABOLIC PANEL (ZC58)     Status: Abnormal   Collection Time    12/18/13 12:15 PM      Result Value Ref Range   Sodium 138  136 - 145 mEq/L   Potassium 4.3  3.5 - 5.1 mEq/L   Chloride 103  98 - 109 mEq/L   CO2 23  22 - 29 mEq/L   Glucose 87  70 - 140 mg/dl   BUN 14.2  7.0 - 26.0 mg/dL   Creatinine 0.8  0.6 - 1.1 mg/dL   Total Bilirubin 0.78  0.20 - 1.20 mg/dL   Alkaline Phosphatase 108  40 - 150 U/L   AST 82 (*) 5 - 34 U/L   ALT 49  0 - 55 U/L   Total Protein 6.8  6.4 - 8.3 g/dL   Albumin 3.5  3.5 - 5.0 g/dL   Calcium 9.6  8.4 - 10.4 mg/dL   Anion Gap 13 (*) 3 - 11 mEq/L  CBC WITH DIFFERENTIAL     Status: Abnormal   Collection Time    12/18/13 12:16 PM      Result Value Ref Range   WBC 8.5  3.9 - 10.3 10e3/uL   NEUT# 6.4  1.5 - 6.5 10e3/uL   HGB 13.1  11.6 - 15.9 g/dL   HCT 40.3  34.8 - 46.6 %   Platelets 441 (*) 145 - 400 10e3/uL   MCV 92.4  79.5 - 101.0 fL   MCH 30.2  25.1 - 34.0 pg   MCHC 32.6  31.5 - 36.0 g/dL   RBC 4.35  3.70 - 5.45 10e6/uL   RDW 12.9  11.2 - 14.5 %   lymph# 1.0  0.9 - 3.3 10e3/uL   MONO# 0.6  0.1 - 0.9 10e3/uL   Eosinophils Absolute 0.3  0.0 - 0.5 10e3/uL   Basophils Absolute 0.1  0.0 - 0.1 10e3/uL   NEUT% 76.1  38.4 - 76.8 %   LYMPH% 12.0 (*) 14.0 - 49.7 %   MONO% 7.4  0.0 - 14.0 %   EOS% 3.6  0.0 - 7.0 %   BASO% 0.9  0.0 - 2.0 %  TSH [facility]     Status: None   Collection Time    12/18/13 12:16 PM      Result Value Ref Range   TSH 3.037  0.308 - 3.960 m(IU)/L  CANCER ANTIGEN 27.29     Status: None    Collection Time    12/18/13 12:17 PM      Result Value Ref Range   CA 27.29 4  0 - 39 U/mL  CA 125     Status: None   Collection Time    12/18/13 12:17 PM      Result Value Ref Range   CA 125 2.7  0.0 - 30.2 U/mL  T4, FREE     Status: None   Collection Time    12/18/13 12:17 PM      Result Value Ref Range   Free T4 1.21  0.80 - 1.80 ng/dL  APTT     Status: None   Collection Time    01/09/14  3:14 PM      Result Value Ref Range   aPTT 31  24 - 37 seconds   Comment: This test is for screening purposes only; it should not be used fortherapeutic unfractionated heparin monitoring.  Please refer toHeparin Anti-Xa (94496).  CEA     Status: None   Collection Time    01/09/14  3:14 PM      Result Value Ref Range   CEA 1.8  0.0 - 5.0 ng/mL  AFP TUMOR MARKER     Status: Abnormal   Collection Time    01/09/14  3:14 PM      Result Value Ref Range   AFP-Tumor Marker 25.7 (*) 0.0 - 8.0 ng/mL   Comment:  The [employer]ia Centaur AFP immunoassay method is used.  Results obtainedwith different assay methods or kits cannot be used interchangeably.AFP is a valuable aid in the management of nonseminomatous testicularcancer patients when used in conjunction with      information availablefrom the clinical evaluation and other diagnostic procedures.Increased serum AFP concentrations have also been observed in ataxiatelangiectasia, hereditary tyrosinemia, primary hepatocellularcarcinoma, teratocarcinoma,      gastrointestinal tract cancers with andwithout liver metastases, and in benign hepatic conditions such asacute viral hepatitis, chronic active hepatitis, and cirrhosis.  Thisresult cannot be interpreted as absolute evidence of the presence orabsence of      malignant disease.  This result is not interpretable inpregnant females.  CANCER ANTIGEN 19-9     Status: None   Collection Time    01/09/14  3:14 PM      Result Value Ref Range   CA 19-9 7.0  <35.0 U/mL  PROTHROMBIN TIME     Status: None    Collection Time    01/09/14  3:14 PM      Result Value Ref Range   Prothrombin Time 13.1  11.6 - 15.2 seconds   INR 1.00  <1.50   Comment: The INR is of principal utility in following patients on stable dosesof oral anticoagulants.  The therapeutic range is generally 2.0 to3.0, but may be 3.0 to 4.0 in patients with mechanical cardiac valves,recurrent embolisms and antiphospholipid      antibodies (including lupusinhibitors).  GLUCOSE, CAPILLARY     Status: None   Collection Time    01/15/14  1:53 PM      Result Value Ref Range   Glucose-Capillary 94  70 - 99 mg/dL  RADIOLOGY RESULTS: See E-Chart or I-Site for most recent results.  Images and reports are reviewed.  Ct Abdomen Pelvis W Contrast  01/07/2014   CLINICAL DATA:  Right lower quadrant abdominal pain, weight loss and decreased appetite. History of bilateral breast cancer.  EXAM: CT ABDOMEN AND PELVIS WITH CONTRAST  TECHNIQUE: Multidetector CT imaging of the abdomen and pelvis was performed using the standard protocol following bolus administration of intravenous contrast.  CONTRAST:  16mL OMNIPAQUE IOHEXOL 300 MG/ML  SOLN  COMPARISON:  None.  FINDINGS: The lung bases demonstrate a calcified granuloma in the right lower lobe and calcified infrahilar lymph nodes on the right. No worrisome pulmonary nodules or pleural effusion. The heart is normal in size. No pericardial effusion.  Large necrotic appearing mass occupying the right hepatic lobe of the liver with extensive vascular enhancement. This is most likely metastatic disease. There are smaller lesions noted in the upper aspect of the right hepatic lobe and in the medial segment of the left hepatic lobe.  The gallbladder is displaced and compressed but is otherwise normal. No common bile duct dilatation. The pancreas is unremarkable. Mild mass effect on the head of the pancreas. The spleen is normal in size. No splenic lesions. The adrenal glands and kidneys are unremarkable. There  is mild mass effect on the right kidney which is displaced posteriorly.  The stomach, duodenum, small bowel and colon are grossly normal. No inflammatory changes, mass lesions or obstructive findings. No mesenteric or retroperitoneal mass or lymphadenopathy. The aorta and branch vessels are patent. Moderate atherosclerotic calcifications distally. The major venous structures are patent. The portal and hepatic veins are patent.  A calcified uterine fibroid is noted. Both ovaries are normal. No pelvic mass, adenopathy or free pelvic fluid collections. No inguinal mass or adenopathy.  The bony structures are intact. No findings to suggest bony metastatic disease. There is advanced facet disease noted in the lumbar spine.  IMPRESSION: 1. 22 cm necrotic right hepatic lobe mass likely accounting for the patient's symptoms. There are other smaller metastatic lesions in the liver. Recommend image guided biopsy. 2. No abdominal/pelvic lymphadenopathy. 3. No findings for osseous metastatic disease. 4. Remote granulomatous disease involving the right lung.   Electronically Signed   By: Kalman Jewels M.D.   On: 01/07/2014 17:06   Nm Pet Image Initial (pi) Whole Body  01/15/2014   CLINICAL DATA:  Initial treatment strategy for liver mass. History of breast cancer and melanoma.  EXAM: NUCLEAR MEDICINE PET WHOLE BODY  TECHNIQUE: 9.4 mCi F-18 FDG was injected intravenously. Full-ring PET imaging was performed from the vertex to the feet after the radiotracer. CT data was obtained and used for attenuation correction and anatomic localization.  FASTING BLOOD GLUCOSE:  Value: 94mg /dl  COMPARISON:  CT abdomen 6/16 /2015  FINDINGS: Head/Neck: No hypermetabolic lymph nodes in the neck.  Chest: No hypermetabolic mediastinal or hilar nodes. No suspicious pulmonary nodules on the CT scan.  Abdomen/Pelvis: There is a large hypermetabolic mass occupying expanding the right hepatic lobe of the liver. This mass measures up to 13 x 14 cm  axial dimension and a 22 cm in craniocaudad dimension. This mass is intensely hypermetabolic with SUV max equal 16.8. There is a separate lesion in the more superior right hepatic lobe.  No hypermetabolic periportal lymph nodes. No hypermetabolic retroperitoneal lymph nodes.  No additional abnormal metabolic activity within the abdomen pelvis other than benign metabolic activity associated with sigmoid diverticulosis.  Skeleton: No focal hypermetabolic activity to suggest  skeletal metastasis.  Extremities: No hypermetabolic activity to suggest metastasis. No cutaneous lesion identified.  IMPRESSION: 1. Intense hypermetabolic mass within the liver consistent with hepatic metastasis. 2. No additional abnormal metabolic activity on the whole-body scan to suggest metastasis or recurrence.   Electronically Signed   By: Suzy Bouchard M.D.   On: 01/15/2014 16:07      ASSESSMENT AND PLAN: Malignant neoplasm of liver, primary I am concerned that this may be a hepatocellular cancer.  Her AST is slightly elevated and her PET scan is negative for any suspicious lesions anywhere else.  This is unlikely to be breast or melanoma as both of those diagnoses were stage I when they were removed.  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  This would provide hopefully some shrinkage in the right side of the liver and some enlargement in the left side of the liver. I also think this minimizes her risk for blood loss during surgery.  I do agree that a biopsy of the lower right part of the tumor would be very risky for hemorrhage, but in looking at her CT scan carefully with radiology, there are some higher portions that are intrahepatic that would be amenable to ultrasound-guided biopsy. I think having a diagnosis could also be extremely helpful.  I discussed this with interventional radiology. We are going to get these  2 things set up.  I will also set up a CT scan 4 weeks after embolization. I would like to see her back after the CT scan and before surgery. I will also set up surgery for around 5 weeks after embolization.  We briefly discussed surgery.   45 min spent in evaluation, examination, counseling, and coordination of care.  Pt's daughter was also present.    Milus Height MD Surgical Oncology, General and [facility]      Visit Diagnoses: 1. Malignant neoplasm of liver, primary     Primary Care Physician: Gennette Pac, MD

## 2014-01-17 NOTE — Patient Instructions (Signed)
We will set up portal vein embolization and biopsy.  Also, we will set up CT 4 weeks afterward.  Then surgery will be set up around 5 weeks after embolization.  I will see you after ct/before surgery to discuss.

## 2014-01-20 ENCOUNTER — Other Ambulatory Visit (INDEPENDENT_AMBULATORY_CARE_PROVIDER_SITE_OTHER): Payer: Self-pay | Admitting: General Surgery

## 2014-01-20 DIAGNOSIS — R160 Hepatomegaly, not elsewhere classified: Secondary | ICD-10-CM

## 2014-01-22 ENCOUNTER — Encounter (HOSPITAL_COMMUNITY): Payer: Self-pay

## 2014-01-22 ENCOUNTER — Ambulatory Visit (HOSPITAL_COMMUNITY)
Admission: RE | Admit: 2014-01-22 | Discharge: 2014-01-22 | Disposition: A | Discharge status: Home / Self Care | Payer: Medicare Other | Source: Ambulatory Visit | Attending: General Surgery | Admitting: General Surgery

## 2014-01-22 DIAGNOSIS — C229 Malignant neoplasm of liver, not specified as primary or secondary: Secondary | ICD-10-CM | POA: Insufficient documentation

## 2014-01-22 DIAGNOSIS — I10 Essential (primary) hypertension: Secondary | ICD-10-CM | POA: Insufficient documentation

## 2014-01-22 DIAGNOSIS — E781 Pure hyperglyceridemia: Secondary | ICD-10-CM | POA: Insufficient documentation

## 2014-01-22 DIAGNOSIS — K7689 Other specified diseases of liver: Secondary | ICD-10-CM | POA: Insufficient documentation

## 2014-01-22 DIAGNOSIS — Z87891 Personal history of nicotine dependence: Secondary | ICD-10-CM | POA: Insufficient documentation

## 2014-01-22 DIAGNOSIS — Z85820 Personal history of malignant melanoma of skin: Secondary | ICD-10-CM | POA: Insufficient documentation

## 2014-01-22 DIAGNOSIS — Z853 Personal history of malignant neoplasm of breast: Secondary | ICD-10-CM | POA: Insufficient documentation

## 2014-01-22 DIAGNOSIS — C228 Malignant neoplasm of liver, primary, unspecified as to type: Secondary | ICD-10-CM

## 2014-01-22 DIAGNOSIS — Z79899 Other long term (current) drug therapy: Secondary | ICD-10-CM | POA: Insufficient documentation

## 2014-01-22 LAB — CBC
HCT: 37.3 % (ref 36.0–46.0)
HEMOGLOBIN: 12.3 g/dL (ref 12.0–15.0)
MCH: 29.6 pg (ref 26.0–34.0)
MCHC: 33 g/dL (ref 30.0–36.0)
MCV: 89.9 fL (ref 78.0–100.0)
Platelets: 537 10*3/uL — ABNORMAL HIGH (ref 150–400)
RBC: 4.15 MIL/uL (ref 3.87–5.11)
RDW: 13.2 % (ref 11.5–15.5)
WBC: 8.9 10*3/uL (ref 4.0–10.5)

## 2014-01-22 LAB — APTT: APTT: 31 s (ref 24–37)

## 2014-01-22 LAB — PROTIME-INR
INR: 1.01 (ref 0.00–1.49)
Prothrombin Time: 13.3 seconds (ref 11.6–15.2)

## 2014-01-22 MED ORDER — NAPROXEN 250 MG PO TABS
250.0000 mg | ORAL_TABLET | Freq: Once | ORAL | Status: AC
  Administered 2014-01-22: 250 mg via ORAL
  Filled 2014-01-22: qty 1

## 2014-01-22 MED ORDER — FENTANYL CITRATE 0.05 MG/ML IJ SOLN
INTRAMUSCULAR | Status: AC | PRN
  Administered 2014-01-22: 25 ug via INTRAVENOUS
  Administered 2014-01-22: 50 ug via INTRAVENOUS
  Administered 2014-01-22: 25 ug via INTRAVENOUS

## 2014-01-22 MED ORDER — FENTANYL CITRATE 0.05 MG/ML IJ SOLN
INTRAMUSCULAR | Status: AC
  Filled 2014-01-22: qty 6

## 2014-01-22 MED ORDER — SODIUM CHLORIDE 0.9 % IV SOLN
INTRAVENOUS | Status: DC
  Administered 2014-01-22: 09:00:00 via INTRAVENOUS

## 2014-01-22 MED ORDER — MIDAZOLAM HCL 2 MG/2ML IJ SOLN
INTRAMUSCULAR | Status: AC | PRN
  Administered 2014-01-22: 1 mg via INTRAVENOUS
  Administered 2014-01-22 (×2): 0.5 mg via INTRAVENOUS

## 2014-01-22 MED ORDER — MIDAZOLAM HCL 2 MG/2ML IJ SOLN
INTRAMUSCULAR | Status: AC
  Filled 2014-01-22: qty 6

## 2014-01-22 NOTE — Progress Notes (Signed)
Pt's states she feels much better, BP and HR improved(see doc flowsheet).  Pt encouraged to change positions slowly and drink plenty of fluids.  Pt verbalized understanding and discharged to home with daughter.

## 2014-01-22 NOTE — Progress Notes (Signed)
Pain 2/10" My side hurts but i don't want anything strong for pain" "can we chang my postion?" It hurts some when I take a deep breath" Warm blanket applied to right shoulder. HOB elevated to 45 degrees. Pt was seen by Rowe Robert PA earlier and no swelling noted RUQ, . Pt " does not want anything strong for pain"  HOB has helped with the discomfort but I did place a call to radiology Dr Anselm Pancoast to see if patient could have something for pain if needed

## 2014-01-22 NOTE — Discharge Instructions (Signed)
Conscious Sedation Sedation is the use of medicines to promote relaxation and relieve discomfort and anxiety. Conscious sedation is a type of sedation. Under conscious sedation you are less alert than normal but are still able to respond to instructions or stimulation. Conscious sedation is used during short medical and dental procedures. It is milder than deep sedation or general anesthesia and allows you to return to your regular activities sooner.  LET [facility] CARE PROVIDER KNOW ABOUT:   Any allergies you have.  All medicines you are taking, including vitamins, herbs, eye drops, creams, and over-the-counter medicines.  Use of steroids (by mouth or creams).  Previous problems you or members of your family have had with the use of anesthetics.  Any blood disorders you have.  Previous surgeries you have had.  Medical conditions you have.  Possibility of pregnancy, if this applies.  Use of cigarettes, alcohol, or illegal drugs. RISKS AND COMPLICATIONS Generally, this is a safe procedure. However, as with any procedure, problems can occur. Possible problems include:  Oversedation.  Trouble breathing on your own. You may need to have a breathing tube until you are awake and breathing on your own.  Allergic reaction to any of the medicines used for the procedure. BEFORE THE PROCEDURE  You may have blood tests done. These tests can help show how well your kidneys and liver are working. They can also show how well your blood clots.  A physical exam will be done.  Only take medicines as directed by your health care provider. You may need to stop taking medicines (such as blood thinners, aspirin, or nonsteroidal anti-inflammatory drugs) before the procedure.   Do not eat or drink at least 6 hours before the procedure or as directed by your health care provider.  Arrange for a responsible adult, family member, or friend to take you home after the procedure. He or she should stay  with you for at least 24 hours after the procedure, until the medicine has worn off. PROCEDURE   An intravenous (IV) catheter will be inserted into one of your veins. Medicine will be able to flow directly into your body through this catheter. You may be given medicine through this tube to help prevent pain and help you relax.  The medical or dental procedure will be done. AFTER THE PROCEDURE  You will stay in a recovery area until the medicine has worn off. Your blood pressure and pulse will be checked.   Depending on the procedure you had, you may be allowed to go home when you can tolerate liquids and your pain is under control. Document Released: 04/05/2001 Document Revised: 07/16/2013 Document Reviewed: 03/18/2013 [facility] Patient Information 2015 [place], [place]. This information is not intended to replace advice given to you by your health care provider. Make sure you discuss any questions you have with your health care provider.   Biopsy Care After Refer to this sheet in the next few weeks. These instructions provide you with information on caring for yourself after your procedure. Your caregiver may also give you more specific instructions. Your treatment has been planned according to current medical practices, but problems sometimes occur. Call your caregiver if you have any problems or questions after your procedure. If you had a fine needle biopsy, you may have soreness at the biopsy site for 1 to 2 days. If you had an open biopsy, you may have soreness at the biopsy site for 3 to 4 days. HOME CARE INSTRUCTIONS   You may  resume normal diet and activities as directed.  Change bandages (dressings) as directed. If your wound was closed with a skin glue (adhesive), it will wear off and begin to peel in 7 days.  Only take over-the-counter or prescription medicines for pain, discomfort, or fever as directed by your caregiver.  Ask your caregiver when you can bathe and get your  wound wet. SEEK IMMEDIATE MEDICAL CARE IF:   You have increased bleeding (more than a small spot) from the biopsy site.  You notice redness, swelling, or increasing pain at the biopsy site.  You have pus coming from the biopsy site.  You have a fever.  You notice a bad smell coming from the biopsy site or dressing.  You have a rash, have difficulty breathing, or have any allergic problems. MAKE SURE YOU:   Understand these instructions.  Will watch your condition.  Will get help right away if you are not doing well or get worse. Document Released: 01/28/2005 Document Revised: 10/03/2011 Document Reviewed: 01/06/2011 [facility] Patient Information 2015 [place], [place]. This information is not intended to replace advice given to you by your health care provider. Make sure you discuss any questions you have with your health care provider.

## 2014-01-22 NOTE — Procedures (Signed)
US guided biopsy of a right hepatic lesion.  3 cores obtained, no immediate complication.

## 2014-01-22 NOTE — Progress Notes (Signed)
Pt sitting up on side of bed states she feels very dizzy and lightheaded.  BP 104/52 Hr 62.  Pt back in bed with feet up.  Will continue to monitor pt.

## 2014-01-22 NOTE — H&P (Signed)
Shelby Hood is an 73 y.o. female.   Chief Complaint: "I'm here for a liver biopsy" HPI: Patient with remote history of stage 1 breast cancer/melanoma, mildly elevated AFP and recent imaging revealing large hypermetabolic right hepatic lobe lesions presents today for US guided liver lesion biopsy.  Past Medical History  Diagnosis Date  . Osteoarthritis   . Hypertension   . Hypertriglyceridemia   . History of breast cancer 02/22/2012  . Breast cancer 01/2009  . Melanoma 2006    stage I; right anterior chest wall; s/p resection     Past Surgical History  Procedure Laterality Date  . Skin biopsy    . Ectopic pregnancy surgery    . Tubal ligation    . Breast lumpectomy      bilateral  . Melanoma excision      Family History  Problem Relation Age of Onset  . Cancer Mother     breast ca  . Cancer Brother     ?lung ca   Social History:  reports that she quit smoking about 47 years ago. She has never used smokeless tobacco. She reports that she drinks about 4.2 ounces of alcohol per week. She reports that she does not use illicit drugs.  Allergies:  Allergies  Allergen Reactions  . Codeine Other (See Comments)    Mental upset   . Tetanus Toxoids Swelling    Current outpatient prescriptions:levothyroxine (SYNTHROID, LEVOTHROID) 100 MCG tablet, Take 100 mcg by mouth daily before breakfast. , Disp: , Rfl: ;  amLODipine (NORVASC) 2.5 MG tablet, Take 2.5 mg by mouth daily at 12 noon. , Disp: , Rfl: ;  gemfibrozil (LOPID) 600 MG tablet, Take 600 mg by mouth daily., Disp: , Rfl: ;  ibuprofen (ADVIL,MOTRIN) 200 MG tablet, Take 600-800 mg by mouth every 6 (six) hours as needed for moderate pain. , Disp: , Rfl:  loratadine (CLARITIN) 10 MG tablet, Take 10 mg by mouth daily., Disp: , Rfl: ;  losartan (COZAAR) 100 MG tablet, Take 100 mg by mouth daily at 12 noon. , Disp: , Rfl:  Current facility-administered medications:0.9 %  sodium chloride infusion, , Intravenous, Continuous, Hedy Jacob, PA-C, Last Rate: 10 mL/hr at 01/22/14 0831   Results for orders placed during the hospital encounter of 01/22/14 (from the past 48 hour(s))  CBC     Status: Abnormal   Collection Time    01/22/14  8:20 AM      Result Value Ref Range   WBC 8.9  4.0 - 10.5 K/uL   RBC 4.15  3.87 - 5.11 MIL/uL   Hemoglobin 12.3  12.0 - 15.0 g/dL   HCT 37.3  36.0 - 46.0 %   MCV 89.9  78.0 - 100.0 fL   MCH 29.6  26.0 - 34.0 pg   MCHC 33.0  30.0 - 36.0 g/dL   RDW 13.2  11.5 - 15.5 %   Platelets 537 (*) 150 - 400 K/uL   PT 13.3  INR 1.01 (01/22/14)  Review of Systems  Constitutional: Positive for weight loss and malaise/fatigue. Negative for fever and chills.  Respiratory: Negative for cough and shortness of breath.   Cardiovascular: Negative for chest pain.  Gastrointestinal: Negative for nausea, vomiting, abdominal pain and blood in stool.  Genitourinary: Negative for dysuria and hematuria.  Musculoskeletal: Negative for back pain.  Neurological: Negative for headaches.  Endo/Heme/Allergies: Does not bruise/bleed easily.  Psychiatric/Behavioral: The patient is nervous/anxious.     Blood pressure 140/62, pulse 76, temperature 97.5  F (36.4 C), temperature source Oral, resp. rate 16, height 5\' 8"  (1.727 m), weight 164 lb 6 oz (74.56 kg), SpO2 95.00%. Physical Exam  Constitutional: She is oriented to person, place, and time.  Thin WF in NAD  Cardiovascular: Normal rate and regular rhythm.   Respiratory: Effort normal and breath sounds normal.  GI: Soft. Bowel sounds are normal.  hepatomegaly  Musculoskeletal: Normal range of motion. She exhibits no edema.  Neurological: She is alert and oriented to person, place, and time.     Assessment/Plan Patient with remote history of stage 1 breast cancer/melanoma, mildly elevated AFP and recent imaging revealing large hypermetabolic right hepatic lobe lesions presents today for US guided liver lesion biopsy. Details/risks of procedure d/w pt/daughter  with their understanding and consent.    Siaosi Alter,D KEVIN 01/22/2014, 8:53 AM

## 2014-01-23 ENCOUNTER — Other Ambulatory Visit: Payer: Self-pay | Admitting: Radiology

## 2014-01-24 ENCOUNTER — Encounter (HOSPITAL_COMMUNITY): Payer: Self-pay | Admitting: Pharmacy Technician

## 2014-01-27 ENCOUNTER — Encounter: Payer: Self-pay | Admitting: Hematology and Oncology

## 2014-01-27 ENCOUNTER — Telehealth (INDEPENDENT_AMBULATORY_CARE_PROVIDER_SITE_OTHER): Payer: Self-pay | Admitting: General Surgery

## 2014-01-27 ENCOUNTER — Ambulatory Visit (HOSPITAL_BASED_OUTPATIENT_CLINIC_OR_DEPARTMENT_OTHER): Payer: Medicare Other | Admitting: Hematology and Oncology

## 2014-01-27 VITALS — BP 151/57 | HR 88 | Temp 97.9°F | Resp 19 | Ht 68.0 in | Wt 163.0 lb

## 2014-01-27 DIAGNOSIS — C228 Malignant neoplasm of liver, primary, unspecified as to type: Secondary | ICD-10-CM

## 2014-01-27 DIAGNOSIS — Z853 Personal history of malignant neoplasm of breast: Secondary | ICD-10-CM

## 2014-01-27 DIAGNOSIS — R630 Anorexia: Secondary | ICD-10-CM | POA: Insufficient documentation

## 2014-01-27 NOTE — Telephone Encounter (Signed)
Discussed post op issues with patient's daughter.

## 2014-01-27 NOTE — Progress Notes (Signed)
[facility] OFFICE PROGRESS NOTE  Patient Care Team: Hulan Fess, MD as PCP - General (Family Medicine) Haywood Lasso, MD (General Surgery)  SUMMARY OF ONCOLOGIC HISTORY: Oncology History   Malignant neoplasm of liver, primary   Primary site: Liver (Excluding Intrahepatic Bile Ducts)   Staging method: AJCC 7th Edition   Clinical: Stage IIIA (T3a, N0, M0) signed by Heath Lark, MD on 01/27/2014 12:57 PM   Summary: Stage IIIA (T3a, N0, M0)       Malignant neoplasm of liver, primary   02/26/2009 Surgery She had  bilateral breast cancer status post bilateral lumpectomy.    02/27/2009 - 06/30/2011 Chemotherapy She was placed on adjuvant letrozole but self discontinued due to mild just and arthralgias.   03/03/2009 -  Radiation Therapy The patient had MammoSite radiation as adjuvant treatment.   01/07/2014 Imaging CT scan for weight loss show multiple liver masses.   01/17/2014 Imaging PET CT scan show no evidence of metastatic disease elsewhere. 2 liver lesions were noted.   01/22/2014 Procedure Ultrasound-guided biopsy confirmed hepatocellular carcinoma.   INTERVAL HISTORY: Please see below for problem oriented charting. She has anorexia and daily night sweats. She denies any pain. She also have early satiety and some reflux symptoms.  REVIEW OF SYSTEMS:   Constitutional: Denies fevers, chills or abnormal weight loss Eyes: Denies blurriness of vision Ears, nose, mouth, throat, and face: Denies mucositis or sore throat Respiratory: Denies cough, dyspnea or wheezes Cardiovascular: Denies palpitation, chest discomfort or lower extremity swelling Gastrointestinal:  Denies nausea, heartburn or change in bowel habits Skin: Denies abnormal skin rashes Lymphatics: Denies new lymphadenopathy or easy bruising Neurological:Denies numbness, tingling or new weaknesses Behavioral/Psych: Mood is stable, no new changes  All other systems were reviewed with the patient and are  negative.  I have reviewed the past medical history, past surgical history, social history and family history with the patient and they are unchanged from previous note.  ALLERGIES:  is allergic to codeine and tetanus toxoids.  MEDICATIONS:  Current Outpatient Prescriptions  Medication Sig Dispense Refill  . ibuprofen (ADVIL,MOTRIN) 200 MG tablet Take 600-800 mg by mouth every 6 (six) hours as needed for moderate pain.       Marland Kitchen levothyroxine (SYNTHROID, LEVOTHROID) 100 MCG tablet Take 100 mcg by mouth daily before breakfast.        No current facility-administered medications for this visit.    PHYSICAL EXAMINATION: ECOG PERFORMANCE STATUS: 1 - Symptomatic but completely ambulatory  Filed Vitals:   01/27/14 1228  BP: 151/57  Pulse: 88  Temp: 97.9 F (36.6 C)  Resp: 19   Filed Weights   01/27/14 1228  Weight: 163 lb (73.936 kg)    GENERAL:alert, no distress and comfortable SKIN: skin color, texture, turgor are normal, no rashes or significant lesions EYES: normal, Conjunctiva are pink and non-injected, sclera clear Musculoskeletal:no cyanosis of digits and no clubbing  NEURO: alert & oriented x 3 with fluent speech, no focal motor/sensory deficits  LABORATORY DATA:  I have reviewed the data as listed    Component Value Date/Time   NA 138 12/18/2013 1215   NA 143 02/22/2012 1127   K 4.3 12/18/2013 1215   K 5.0 02/22/2012 1127   CL 106 02/22/2012 1127   CO2 23 12/18/2013 1215   CO2 25 02/22/2012 1127   GLUCOSE 87 12/18/2013 1215   GLUCOSE 89 02/22/2012 1127   BUN 14.2 12/18/2013 1215   BUN 16 02/22/2012 1127   CREATININE 0.8 12/18/2013 1215  CREATININE 1.02 02/22/2012 1127   CALCIUM 9.6 12/18/2013 1215   CALCIUM 10.1 02/22/2012 1127   PROT 6.8 12/18/2013 1215   PROT 6.8 02/22/2012 1127   ALBUMIN 3.5 12/18/2013 1215   ALBUMIN 4.7 02/22/2012 1127   AST 82* 12/18/2013 1215   AST 19 02/22/2012 1127   ALT 49 12/18/2013 1215   ALT 13 02/22/2012 1127   ALKPHOS 108 12/18/2013 1215    ALKPHOS 82 02/22/2012 1127   BILITOT 0.78 12/18/2013 1215   BILITOT 0.7 02/22/2012 1127   GFRNONAA >60 03/12/2009 0924   GFRAA  Value: >60        The eGFR has been calculated using the MDRD equation. This calculation has not been validated in all clinical situations. eGFR's persistently <60 mL/min signify possible Chronic Kidney Disease. 03/12/2009 0924    No results found for this basename: SPEP, UPEP,  kappa and lambda light chains    Lab Results  Component Value Date   WBC 8.9 01/22/2014   NEUTROABS 6.4 12/18/2013   HGB 12.3 01/22/2014   HCT 37.3 01/22/2014   MCV 89.9 01/22/2014   PLT 537* 01/22/2014      Chemistry      Component Value Date/Time   NA 138 12/18/2013 1215   NA 143 02/22/2012 1127   K 4.3 12/18/2013 1215   K 5.0 02/22/2012 1127   CL 106 02/22/2012 1127   CO2 23 12/18/2013 1215   CO2 25 02/22/2012 1127   BUN 14.2 12/18/2013 1215   BUN 16 02/22/2012 1127   CREATININE 0.8 12/18/2013 1215   CREATININE 1.02 02/22/2012 1127      Component Value Date/Time   CALCIUM 9.6 12/18/2013 1215   CALCIUM 10.1 02/22/2012 1127   ALKPHOS 108 12/18/2013 1215   ALKPHOS 82 02/22/2012 1127   AST 82* 12/18/2013 1215   AST 19 02/22/2012 1127   ALT 49 12/18/2013 1215   ALT 13 02/22/2012 1127   BILITOT 0.78 12/18/2013 1215   BILITOT 0.7 02/22/2012 1127     ASSESSMENT & PLAN:  Malignant neoplasm of liver, primary I reviewed the biopsy report with her. I would defer to Gen. surgery for plan of surgical resection since that the patient appeared to have possible resectable disease. I have not make a return appointment for the patient to come back until she has fully recovered from the surgery.  History of breast cancer I recommend deferring her mammogram until she has completed her surgery. I will see her back in the future once she is ready.  Anorexia I recommend increase diet and frequent small meals.   No orders of the defined types were placed in this encounter.   All questions were answered. The  patient knows to call the clinic with any problems, questions or concerns. No barriers to learning was detected. I spent 15 minutes counseling the patient face to face. The total time spent in the appointment was 20 minutes and more than 50% was on counseling and review of test results     [facility], [place], MD 01/27/2014 1:19 PM

## 2014-01-27 NOTE — Assessment & Plan Note (Signed)
I reviewed the biopsy report with her. I would defer to Gen. surgery for plan of surgical resection since that the patient appeared to have possible resectable disease. I have not make a return appointment for the patient to come back until she has fully recovered from the surgery.

## 2014-01-27 NOTE — Assessment & Plan Note (Signed)
I recommend increase diet and frequent small meals.

## 2014-01-27 NOTE — Assessment & Plan Note (Signed)
I recommend deferring her mammogram until she has completed her surgery. I will see her back in the future once she is ready.

## 2014-01-27 NOTE — Telephone Encounter (Signed)
Discussed path with patient and overall plan.  Will need to see back for pre op.

## 2014-01-28 ENCOUNTER — Ambulatory Visit
Admission: RE | Admit: 2014-01-28 | Discharge: 2014-01-28 | Disposition: A | Discharge status: Home / Self Care | Payer: Medicare Other | Source: Ambulatory Visit | Attending: General Surgery | Admitting: General Surgery

## 2014-01-28 DIAGNOSIS — C228 Malignant neoplasm of liver, primary, unspecified as to type: Secondary | ICD-10-CM

## 2014-01-29 ENCOUNTER — Other Ambulatory Visit (INDEPENDENT_AMBULATORY_CARE_PROVIDER_SITE_OTHER): Payer: Self-pay | Admitting: General Surgery

## 2014-01-29 ENCOUNTER — Encounter (HOSPITAL_COMMUNITY): Payer: Self-pay

## 2014-01-29 ENCOUNTER — Ambulatory Visit (HOSPITAL_COMMUNITY): Payer: Medicare Other

## 2014-01-29 ENCOUNTER — Ambulatory Visit (HOSPITAL_COMMUNITY)
Admission: RE | Admit: 2014-01-29 | Discharge: 2014-01-29 | Disposition: A | Discharge status: Home / Self Care | Payer: Medicare Other | Source: Ambulatory Visit | Attending: General Surgery | Admitting: General Surgery

## 2014-01-29 ENCOUNTER — Other Ambulatory Visit (HOSPITAL_COMMUNITY): Payer: Medicare Other

## 2014-01-29 ENCOUNTER — Observation Stay (HOSPITAL_COMMUNITY)
Admission: AD | Admit: 2014-01-29 | Discharge: 2014-01-30 | Disposition: A | Discharge status: Home / Self Care | Payer: Medicare Other | Source: Ambulatory Visit | Attending: Interventional Radiology | Admitting: Interventional Radiology

## 2014-01-29 VITALS — BP 139/65 | HR 77 | Temp 100.1°F | Resp 16 | Ht 68.0 in | Wt 162.0 lb

## 2014-01-29 DIAGNOSIS — Z853 Personal history of malignant neoplasm of breast: Secondary | ICD-10-CM | POA: Insufficient documentation

## 2014-01-29 DIAGNOSIS — Z85820 Personal history of malignant melanoma of skin: Secondary | ICD-10-CM | POA: Insufficient documentation

## 2014-01-29 DIAGNOSIS — M1990 Unspecified osteoarthritis, unspecified site: Secondary | ICD-10-CM | POA: Insufficient documentation

## 2014-01-29 DIAGNOSIS — Z87891 Personal history of nicotine dependence: Secondary | ICD-10-CM | POA: Insufficient documentation

## 2014-01-29 DIAGNOSIS — C220 Liver cell carcinoma: Secondary | ICD-10-CM | POA: Diagnosis present

## 2014-01-29 DIAGNOSIS — C228 Malignant neoplasm of liver, primary, unspecified as to type: Secondary | ICD-10-CM

## 2014-01-29 DIAGNOSIS — E781 Pure hyperglyceridemia: Secondary | ICD-10-CM | POA: Insufficient documentation

## 2014-01-29 DIAGNOSIS — I10 Essential (primary) hypertension: Secondary | ICD-10-CM | POA: Insufficient documentation

## 2014-01-29 DIAGNOSIS — C229 Malignant neoplasm of liver, not specified as primary or secondary: Secondary | ICD-10-CM | POA: Diagnosis present

## 2014-01-29 LAB — CBC WITH DIFFERENTIAL/PLATELET
BASOS ABS: 0.1 10*3/uL (ref 0.0–0.1)
Basophils Relative: 1 % (ref 0–1)
Eosinophils Absolute: 0.6 10*3/uL (ref 0.0–0.7)
Eosinophils Relative: 6 % — ABNORMAL HIGH (ref 0–5)
HEMATOCRIT: 35.6 % — AB (ref 36.0–46.0)
HEMOGLOBIN: 11.7 g/dL — AB (ref 12.0–15.0)
Lymphocytes Relative: 11 % — ABNORMAL LOW (ref 12–46)
Lymphs Abs: 1 10*3/uL (ref 0.7–4.0)
MCH: 29.6 pg (ref 26.0–34.0)
MCHC: 32.9 g/dL (ref 30.0–36.0)
MCV: 90.1 fL (ref 78.0–100.0)
MONOS PCT: 8 % (ref 3–12)
Monocytes Absolute: 0.7 10*3/uL (ref 0.1–1.0)
NEUTROS ABS: 7 10*3/uL (ref 1.7–7.7)
NEUTROS PCT: 74 % (ref 43–77)
Platelets: 419 10*3/uL — ABNORMAL HIGH (ref 150–400)
RBC: 3.95 MIL/uL (ref 3.87–5.11)
RDW: 13.3 % (ref 11.5–15.5)
WBC: 9.3 10*3/uL (ref 4.0–10.5)

## 2014-01-29 LAB — COMPREHENSIVE METABOLIC PANEL
ALBUMIN: 3.2 g/dL — AB (ref 3.5–5.2)
ALK PHOS: 103 U/L (ref 39–117)
ALT: 34 U/L (ref 0–35)
AST: 69 U/L — AB (ref 0–37)
Anion gap: 13 (ref 5–15)
BILIRUBIN TOTAL: 0.6 mg/dL (ref 0.3–1.2)
BUN: 13 mg/dL (ref 6–23)
CHLORIDE: 98 meq/L (ref 96–112)
CO2: 24 meq/L (ref 19–32)
Calcium: 9.6 mg/dL (ref 8.4–10.5)
Creatinine, Ser: 0.75 mg/dL (ref 0.50–1.10)
GFR calc Af Amer: >90 mL/min (ref >90)
GFR calc non Af Amer: 83 mL/min — ABNORMAL LOW (ref >90)
Glucose, Bld: 98 mg/dL (ref 70–99)
Potassium: 4.3 mEq/L (ref 3.7–5.3)
Sodium: 135 mEq/L — ABNORMAL LOW (ref 137–147)
Total Protein: 6.7 g/dL (ref 6.0–8.3)

## 2014-01-29 LAB — PROTIME-INR
INR: 1.03 (ref 0.00–1.49)
Prothrombin Time: 13.5 seconds (ref 11.6–15.2)

## 2014-01-29 LAB — APTT: APTT: 27 s (ref 24–37)

## 2014-01-29 MED ORDER — FENTANYL CITRATE 0.05 MG/ML IJ SOLN
INTRAMUSCULAR | Status: AC
  Filled 2014-01-29: qty 6

## 2014-01-29 MED ORDER — GELATIN ABSORBABLE 12-7 MM EX MISC
CUTANEOUS | Status: AC | PRN
  Administered 2014-01-29: 1 via TOPICAL

## 2014-01-29 MED ORDER — DIPHENHYDRAMINE HCL 25 MG PO CAPS
25.0000 mg | ORAL_CAPSULE | Freq: Four times a day (QID) | ORAL | Status: DC | PRN
Start: 2014-01-29 — End: 2014-01-30

## 2014-01-29 MED ORDER — MIDAZOLAM HCL 2 MG/2ML IJ SOLN
INTRAMUSCULAR | Status: AC
  Filled 2014-01-29: qty 6

## 2014-01-29 MED ORDER — PIPERACILLIN-TAZOBACTAM 3.375 G IVPB
3.3750 g | Freq: Once | INTRAVENOUS | Status: AC
  Administered 2014-01-29: 3.375 g via INTRAVENOUS
  Filled 2014-01-29 (×2): qty 50

## 2014-01-29 MED ORDER — DOCUSATE SODIUM 100 MG PO CAPS
100.0000 mg | ORAL_CAPSULE | Freq: Two times a day (BID) | ORAL | Status: DC
  Filled 2014-01-29 (×3): qty 1

## 2014-01-29 MED ORDER — SODIUM CHLORIDE 0.9 % IV SOLN
INTRAVENOUS | Status: DC
  Administered 2014-01-29: 08:00:00 via INTRAVENOUS

## 2014-01-29 MED ORDER — FENTANYL CITRATE 0.05 MG/ML IJ SOLN
INTRAMUSCULAR | Status: AC | PRN
  Administered 2014-01-29: 25 ug via INTRAVENOUS
  Administered 2014-01-29: 50 ug via INTRAVENOUS
  Administered 2014-01-29: 25 ug via INTRAVENOUS
  Administered 2014-01-29: 50 ug via INTRAVENOUS
  Administered 2014-01-29 (×3): 25 ug via INTRAVENOUS
  Administered 2014-01-29: 50 ug via INTRAVENOUS
  Administered 2014-01-29: 25 ug via INTRAVENOUS

## 2014-01-29 MED ORDER — ONDANSETRON 8 MG/NS 50 ML IVPB
8.0000 mg | Freq: Once | INTRAVENOUS | Status: AC
  Administered 2014-01-29: 8 mg via INTRAVENOUS
  Filled 2014-01-29: qty 8

## 2014-01-29 MED ORDER — MIDAZOLAM HCL 2 MG/2ML IJ SOLN
INTRAMUSCULAR | Status: AC | PRN
  Administered 2014-01-29 (×3): 0.5 mg via INTRAVENOUS
  Administered 2014-01-29: 1 mg via INTRAVENOUS
  Administered 2014-01-29 (×2): 0.5 mg via INTRAVENOUS
  Administered 2014-01-29: 1 mg via INTRAVENOUS
  Administered 2014-01-29 (×3): 0.5 mg via INTRAVENOUS

## 2014-01-29 MED ORDER — IOHEXOL 300 MG/ML  SOLN
INTRAMUSCULAR | Status: AC | PRN
  Administered 2014-01-29: 1 mL

## 2014-01-29 MED ORDER — LOSARTAN POTASSIUM 50 MG PO TABS
100.0000 mg | ORAL_TABLET | Freq: Every day | ORAL | Status: DC
  Filled 2014-01-29: qty 2

## 2014-01-29 MED ORDER — HYDROCODONE-ACETAMINOPHEN 5-325 MG PO TABS
1.0000 | ORAL_TABLET | ORAL | Status: DC | PRN
  Filled 2014-01-29: qty 1

## 2014-01-29 MED ORDER — ONDANSETRON HCL 4 MG/2ML IJ SOLN: 4.0000 mg | Freq: Four times a day (QID) | INTRAMUSCULAR | Status: DC | PRN

## 2014-01-29 MED ORDER — SODIUM CHLORIDE 0.9 % IV SOLN
INTRAVENOUS | Status: DC
  Administered 2014-01-29 – 2014-01-30 (×2): via INTRAVENOUS

## 2014-01-29 MED ORDER — LEVOTHYROXINE SODIUM 100 MCG PO TABS
100.0000 ug | ORAL_TABLET | Freq: Every day | ORAL | Status: DC
  Filled 2014-01-29 (×2): qty 1

## 2014-01-29 MED ORDER — LIDOCAINE HCL 1 % IJ SOLN
INTRAMUSCULAR | Status: AC
  Filled 2014-01-29: qty 20

## 2014-01-29 NOTE — H&P (Signed)
Chief Complaint: "I am here for a procedure for my liver." Referring Physician: Dr. Barry Dienes HPI: Shelby Hood is an 73 y.o. female with history of melanoma and breast cancer. The patient has large right sided liver mass s/p biopsy 01/22/14 which confirmed [place]. The patient is scheduled today for portal vein embolization to promote enhanced growth of the healthy left lobe of the liver prior to any right lobe resection. She denies any chest pain, shortness of breath or palpitations. She denies any active signs of bleeding or excessive bruising. She denies any recent fever or chills. The patient denies any history of sleep apnea or chronic oxygen use. She has previously tolerated sedation without complications.    Past Medical History:  Past Medical History  Diagnosis Date  . Osteoarthritis   . Hypertension   . Hypertriglyceridemia   . History of breast cancer 02/22/2012  . Breast cancer 01/2009  . Melanoma 2006    stage I; right anterior chest wall; s/p resection     Past Surgical History:  Past Surgical History  Procedure Laterality Date  . Skin biopsy    . Ectopic pregnancy surgery    . Tubal ligation    . Breast lumpectomy      bilateral  . Melanoma excision      Family History:  Family History  Problem Relation Age of Onset  . Cancer Mother     breast ca  . Cancer Brother     ?lung ca    Social History:  reports that she quit smoking about 47 years ago. She has never used smokeless tobacco. She reports that she drinks about 4.2 ounces of alcohol per week. She reports that she does not use illicit drugs.  Allergies:  Allergies  Allergen Reactions  . Codeine Other (See Comments)    Mental upset   . Tetanus Toxoids Swelling    Medications:   Medication List    ASK your doctor about these medications       ibuprofen 200 MG tablet  Commonly known as:  ADVIL,MOTRIN  Take 600-800 mg by mouth every 6 (six) hours as needed for moderate pain.     levothyroxine 100 MCG  tablet  Commonly known as:  SYNTHROID, LEVOTHROID  Take 100 mcg by mouth daily before breakfast.     losartan 100 MG tablet  Commonly known as:  COZAAR  Take 100 mg by mouth daily.        Please HPI for pertinent positives, otherwise complete 10 system ROS negative.  Physical Exam: BP 132/66  Pulse 74  Temp(Src) 98.4 F (36.9 C) (Oral)  Resp 16  Ht 5' 8"  (1.727 m)  Wt 163 lb (73.936 kg)  BMI 24.79 kg/m2  SpO2 93% Body mass index is 24.79 kg/(m^2).  General Appearance:  Alert, cooperative, no distress  Head:  Normocephalic, without obvious abnormality, atraumatic  Neck: Supple, symmetrical, trachea midline  Lungs:   Clear to auscultation bilaterally, no w/r/r, respirations unlabored without use of accessory muscles.  Chest Wall:  No tenderness or deformity  Heart:  Regular rate and rhythm, S1, S2 normal, no murmur, rub or gallop.  Abdomen:   Non-tender, right upper and lower palpable mass  Extremities: Extremities normal, atraumatic, no cyanosis or edema  Pulses: 2+ and symmetric  Neurologic: Normal affect, no gross deficits.   Results for orders placed during the hospital encounter of 01/29/14 (from the past 48 hour(s))  APTT     Status: None   Collection Time  01/29/14  8:20 AM      Result Value Ref Range   aPTT 27  24 - 37 seconds  CBC WITH DIFFERENTIAL     Status: Abnormal   Collection Time    01/29/14  8:20 AM      Result Value Ref Range   WBC 9.3  4.0 - 10.5 K/uL   RBC 3.95  3.87 - 5.11 MIL/uL   Hemoglobin 11.7 (*) 12.0 - 15.0 g/dL   HCT 35.6 (*) 36.0 - 46.0 %   MCV 90.1  78.0 - 100.0 fL   MCH 29.6  26.0 - 34.0 pg   MCHC 32.9  30.0 - 36.0 g/dL   RDW 13.3  11.5 - 15.5 %   Platelets 419 (*) 150 - 400 K/uL   Neutrophils Relative % 74  43 - 77 %   Neutro Abs 7.0  1.7 - 7.7 K/uL   Lymphocytes Relative 11 (*) 12 - 46 %   Lymphs Abs 1.0  0.7 - 4.0 K/uL   Monocytes Relative 8  3 - 12 %   Monocytes Absolute 0.7  0.1 - 1.0 K/uL   Eosinophils Relative 6 (*) 0 -  5 %   Eosinophils Absolute 0.6  0.0 - 0.7 K/uL   Basophils Relative 1  0 - 1 %   Basophils Absolute 0.1  0.0 - 0.1 K/uL  COMPREHENSIVE METABOLIC PANEL     Status: Abnormal   Collection Time    01/29/14  8:20 AM      Result Value Ref Range   Sodium 135 (*) 137 - 147 mEq/L   Potassium 4.3  3.7 - 5.3 mEq/L   Chloride 98  96 - 112 mEq/L   CO2 24  19 - 32 mEq/L   Glucose, Bld 98  70 - 99 mg/dL   BUN 13  6 - 23 mg/dL   Creatinine, Ser 0.75  0.50 - 1.10 mg/dL   Calcium 9.6  8.4 - 10.5 mg/dL   Total Protein 6.7  6.0 - 8.3 g/dL   Albumin 3.2 (*) 3.5 - 5.2 g/dL   AST 69 (*) 0 - 37 U/L   ALT 34  0 - 35 U/L   Alkaline Phosphatase 103  39 - 117 U/L   Total Bilirubin 0.6  0.3 - 1.2 mg/dL   GFR calc non Af Amer 83 (*) >90 mL/min   GFR calc Af Amer >90  >90 mL/min   Comment: (NOTE)     The eGFR has been calculated using the CKD EPI equation.     This calculation has not been validated in all clinical situations.     eGFR's persistently <90 mL/min signify possible Chronic Kidney     Disease.   Anion gap 13  5 - 15  PROTIME-INR     Status: None   Collection Time    01/29/14  8:20 AM      Result Value Ref Range   Prothrombin Time 13.5  11.6 - 15.2 seconds   INR 1.03  0.00 - 1.49   No results found.  Assessment/Plan Large right hepatic lesion s/p biopsy 01/22/14 confirming hepatocellular carcinoma Patient is here today for portal vein angiogram with embolization and possible catheter thrombolysis/thrombectomy with moderate sedation. Patient has been NPO, no blood thinners taken, labs and images reviewed, afebrile.  Risks and Benefits discussed with the patient. All of the patient's questions were answered, patient is agreeable to proceed. Consent signed and in chart.   Tsosie Billing D PA-C 01/29/2014,  9:05 AM

## 2014-01-29 NOTE — Discharge Instructions (Signed)
Conscious Sedation °Sedation is the use of medicines to promote relaxation and relieve discomfort and anxiety. Conscious sedation is a type of sedation. Under conscious sedation you are less alert than normal but are still able to respond to instructions or stimulation. Conscious sedation is used during short medical and dental procedures. It is milder than deep sedation or general anesthesia and allows you to return to your regular activities sooner.  °LET YOUR HEALTH CARE PROVIDER KNOW ABOUT:  °· Any allergies you have. °· All medicines you are taking, including vitamins, herbs, eye drops, creams, and over-the-counter medicines. °· Use of steroids (by mouth or creams). °· Previous problems you or members of your family have had with the use of anesthetics. °· Any blood disorders you have. °· Previous surgeries you have had. °· Medical conditions you have. °· Possibility of pregnancy, if this applies. °· Use of cigarettes, alcohol, or illegal drugs. °RISKS AND COMPLICATIONS °Generally, this is a safe procedure. However, as with any procedure, problems can occur. Possible problems include: °· Oversedation. °· Trouble breathing on your own. You may need to have a breathing tube until you are awake and breathing on your own. °· Allergic reaction to any of the medicines used for the procedure. °BEFORE THE PROCEDURE °· You may have blood tests done. These tests can help show how well your kidneys and liver are working. They can also show how well your blood clots. °· A physical exam will be done.   °· Only take medicines as directed by your health care provider. You may need to stop taking medicines (such as blood thinners, aspirin, or nonsteroidal anti-inflammatory drugs) before the procedure.   °· Do not eat or drink at least 6 hours before the procedure or as directed by your health care provider. °· Arrange for a responsible adult, family member, or friend to take you home after the procedure. He or she should stay  with you for at least 24 hours after the procedure, until the medicine has worn off. °PROCEDURE  °· An intravenous (IV) catheter will be inserted into one of your veins. Medicine will be able to flow directly into your body through this catheter. You may be given medicine through this tube to help prevent pain and help you relax. °· The medical or dental procedure will be done. °AFTER THE PROCEDURE °· You will stay in a recovery area until the medicine has worn off. Your blood pressure and pulse will be checked.   °·  Depending on the procedure you had, you may be allowed to go home when you can tolerate liquids and your pain is under control. °Document Released: 04/05/2001 Document Revised: 07/16/2013 Document Reviewed: 03/18/2013 °[employer]® Patient Information ©2015 [employer]. This information is not intended to replace advice given to you by your health care provider. Make sure you discuss any questions you have with your health care provider. ° °

## 2014-01-29 NOTE — Progress Notes (Signed)
Patient arrived to the floor requesting to use the restroom. Explained to patient that physician orders were for her to be on bedrest for 6hrs - ending at 71. Patient expressed concerns that she really has difficulty using a bedpan so she would really like to get up and use the rest room. Dr Laurence Ferrari was notified. In speaking with him he still wanted the patient to remain on bedrest so he gave the option of placing a catheter. Explained this to the patient and reminded her of her risk of bleeding with this particular procedure. Patient didn't want a catheter so agreed to try the bed pan or female urinal. Assisted patient with trying the female urinal and despite orders from the MD and explanation of risks patient still stood on the side of the bed. Patient unable to urinate. Will continue to monitor.

## 2014-01-29 NOTE — Progress Notes (Signed)
Subjective: Patient is s/p portal vein angio and embolization today. She denies any pain and states she is feeling well.   Objective: Physical Exam: BP 150/65  Pulse 78  Temp(Src) 97.4 F (36.3 C) (Oral)  Resp 16  Ht 5\' 8"  (1.727 m)  Wt 162 lb (73.483 kg)  BMI 24.64 kg/m2  SpO2 92%  General: A&Ox3, NAD, sitting up in bed Abd: Soft, NT, ND, (+) BS, left abd access site dressing C/D/I, no signs of bleeding or hematoma.  Labs: CBC  Recent Labs  01/29/14 0820  WBC 9.3  HGB 11.7*  HCT 35.6*  PLT 419*   BMET  Recent Labs  01/29/14 0820  NA 135*  K 4.3  CL 98  CO2 24  GLUCOSE 98  BUN 13  CREATININE 0.75  CALCIUM 9.6   LFT  Recent Labs  01/29/14 0820  PROT 6.7  ALBUMIN 3.2*  AST 69*  ALT 34  ALKPHOS 103  BILITOT 0.6   PT/INR  Recent Labs  01/29/14 0820  LABPROT 13.5  INR 1.03     Studies/Results: Ir Radiologist Eval & Mgmt  01/29/2014   : January 28, 2014  Stark Klein, MD  [address], [place]  [place], [place]  [address]  Re:  Shelby Hood (DOB:  05-15-1941)  Dear Dr. Barry Dienes,  I had the pleasure of seeing Laporsche Hoeger in clinic today for evaluation of her newly diagnosed hepatocellular carcinoma. I know you are very familiar with her past medical history, please allow me to recap for our records.  Jinny Sanders. Devaul is a 73 year old female with a past history of breast cancer in 2010. Several months ago, beginning in May of 2015, she began to notice progressive weakness, weight loss (approximately 10 pounds) and a palpable lump in her right upper quadrant. Workup including a CT scan of the abdomen and pelvis demonstrated a large hypervascular and centrally necrotic liver mass taking up the majority of the right hepatic lobe. Ultrasound-guided biopsy confirmed the diagnosis of hepatocellular carcinoma. Her liver does not appear overtly cirrhotic by imaging. She has no history of hepatitis, intravenous drug use, or heavy alcohol use. Currently, her appetite  is poor, she continues to have weight loss and a sensation of upper abdominal and right upper quadrant fullness. She denies abdominal pain, fever, chills. She does feel nauseated occasionally after eating. No change in bowel habits, hematemesis, hematochezia or melanotic stools.  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  Past Surgical History: Ectopic pregnancy surgery, tubal ligation, breast lumpectomy, melanoma excision and skin biopsy.  Current Medications: Amlodipine 2.5 mg daily, gemfibrozil, 600 mg daily, ibuprofen 200 mg every 6 hours as needed, Synthroid 100 mcg daily, Claritin 10 mg daily as needed, losartan 100 mg daily.  Allergies:  Codeine and tetanus toxoids.  Family History: Significant for history of breast cancer in her mother.  Social History: She is a retired Network engineer at [employer]. Her adult daughter is with her at the clinical appointment. She is widowed and currently lives alone. Her daughter offers good social support and can stay with her following procedures. She is a former smoker but quit years ago in 1968. She drinks a single glass of wine on most days. She has no history of recreational or intravenous drug use. She is not currently sexually active.  Review of Systems: Pertinent positives listed in the HPI. Otherwise, the 12-point review of systems was negative.  Physical Examination:  Vital signs: Temperature: 98.2 degrees, Heart Rate: 81, Blood Pressure: 136/71, Respiratory Rate: 14, Oxygen Saturation: 96% on room air.  General: Well-nourished, well-developed female who appears anxious but is in no acute distress. She appears her stated age.  Cardiac:  Regular rate and rhythm.  Lungs:  Clear to auscultation bilaterally.  Skin:  No rash or purpura.  HEENT:  Sclera are clear and anicteric.  Abdomen: Palpable liver edge approximately 10 cm below the diaphragm. Mild tenderness to palpitation.  Soft and nondistended.  Neuro:  Alert and oriented x4.  No focal deficits.  Psych:  Appropriate affect.  Imaging: CT scan of the abdomen and pelvis demonstrates a 22-cm, centrally necrotic, right hepatic mass completely encompassing hepatic segments 6 and 5. There is significant intratumoral portal venous or arterial venous shunting. A few small satellite lesions are identified in hepatic segment 4A and 4B. The lateral segment of the left hepatic lobe appears disease free. The main portal veins are patent. Subsequent PET imaging on 01/15/2014 confirmed hypermetabolic activity throughout the primary and satellite tumors. No distant metastatic disease or extrahepatic metastatic disease is identified.  Laboratory Evaluation: Hepatic function is currently good. INR is 1.0, platelets are 537, AFP is mildly elevated at 26. Total bilirubin is 0.8, albumin 3.5, creatinine is normal 0.8.  Assessment and Plan:  Unfortunate 73 year old female with very large and centrally necrotic right liver hepatocellular carcinoma. Her liver does not appear overtly cirrhotic by morphology and her hepatic synthetic function is essentially normal. This could represent a spontaneous HCC in the setting of normal liver which is uncommon but has been reported. Alternately, she may have low-grade underlying cirrhosis secondary to NASH, or potentially chronic hepatic B although she has no risk factors for this illness. I would consider hepatitis serologies just for further evaluation.  Her best option for cure is extended right hepatectomy including the medial segments of the left hepatic lobe. Currently, her functional liver remnant including segments 2 and 3 would be borderline sufficient. Preoperative embolization of the right and medial segment left portal veins would offer several benefits including hypertrophy of the functional liver remnant, decreased tumor vascularity and slightly decreased size of the residual normal right hepatic  parenchyma.  Follow-up imaging at 4 weeks would be recommended to evaluate the functional liver remnant followed quickly by surgery. There is some risk of progressive growth of the tumor secondary to the release of hepatotrophic factors from the portal vein embolization procedure.  During the procedure, once access if gained to the portal venous system, a direct portal pressure will be measured. If the patient has significant portal hypertension, that would constitute a contraindication to portal venous embolization. I explained this, the procedure, the risks and complications to both the patient and her daughter who are in agreement. They understand that while this procedure is typically safe, the main complications including bleeding from the hepatic access site and inadvertent embolization or thrombosis of the left portal venous system resulting in decreased future treatment options, and potentially decreased hepatic function/hepatic failure.  They understand and wish to proceed.  Thank you for sending this delightful woman to see me in consultation. I greatly enjoyed meeting her and look forward to participating in her care.  Signed,  Criselda Peaches, MD  [facility]  [facility]   Electronically Signed   By: Jacqulynn Cadet M.D.   On: 01/29/2014 09:48    Assessment/Plan: Large right hepatic lesion s/p biopsy 01/22/14 confirming hepatocellular carcinoma  Patient s/p  portal vein angiogram with embolization, access site without bleeding or hematoma, no pain. Patient admitted overnight for observation. Will check serial vitals and labs Possible discharge in am if stable. Follow-up with Dr. Barry Dienes as instructed.    LOS: 0 days    Rockney Ghee 01/29/2014 4:13 PM

## 2014-01-30 ENCOUNTER — Encounter (HOSPITAL_COMMUNITY): Payer: Self-pay

## 2014-01-30 ENCOUNTER — Telehealth (INDEPENDENT_AMBULATORY_CARE_PROVIDER_SITE_OTHER): Payer: Self-pay | Admitting: *Deleted

## 2014-01-30 ENCOUNTER — Other Ambulatory Visit (INDEPENDENT_AMBULATORY_CARE_PROVIDER_SITE_OTHER): Payer: Self-pay

## 2014-01-30 LAB — COMPREHENSIVE METABOLIC PANEL
ALBUMIN: 2.8 g/dL — AB (ref 3.5–5.2)
ALK PHOS: 102 U/L (ref 39–117)
ALT: 42 U/L — ABNORMAL HIGH (ref 0–35)
AST: 87 U/L — AB (ref 0–37)
Anion gap: 13 (ref 5–15)
BILIRUBIN TOTAL: 0.6 mg/dL (ref 0.3–1.2)
BUN: 10 mg/dL (ref 6–23)
CHLORIDE: 96 meq/L (ref 96–112)
CO2: 24 meq/L (ref 19–32)
Calcium: 9 mg/dL (ref 8.4–10.5)
Creatinine, Ser: 0.78 mg/dL (ref 0.50–1.10)
GFR calc Af Amer: >90 mL/min (ref >90)
GFR, EST NON AFRICAN AMERICAN: 82 mL/min — AB (ref >90)
Glucose, Bld: 104 mg/dL — ABNORMAL HIGH (ref 70–99)
POTASSIUM: 4.3 meq/L (ref 3.7–5.3)
Sodium: 133 mEq/L — ABNORMAL LOW (ref 137–147)
Total Protein: 5.6 g/dL — ABNORMAL LOW (ref 6.0–8.3)

## 2014-01-30 LAB — CBC
HCT: 32.4 % — ABNORMAL LOW (ref 36.0–46.0)
Hemoglobin: 10.4 g/dL — ABNORMAL LOW (ref 12.0–15.0)
MCH: 29.1 pg (ref 26.0–34.0)
MCHC: 32.1 g/dL (ref 30.0–36.0)
MCV: 90.8 fL (ref 78.0–100.0)
Platelets: 388 10*3/uL (ref 150–400)
RBC: 3.57 MIL/uL — AB (ref 3.87–5.11)
RDW: 13.3 % (ref 11.5–15.5)
WBC: 13.7 10*3/uL — AB (ref 4.0–10.5)

## 2014-01-30 LAB — PROTIME-INR
INR: 1.14 (ref 0.00–1.49)
Prothrombin Time: 14.6 seconds (ref 11.6–15.2)

## 2014-01-30 MED ORDER — LORAZEPAM 1 MG PO TABS: 1.0000 mg | ORAL_TABLET | Freq: Three times a day (TID) | ORAL | Status: DC

## 2014-01-30 NOTE — Progress Notes (Signed)
UR Completed Ahlana Slaydon Graves-Bigelow, RN,BSN [phone]  

## 2014-01-30 NOTE — Telephone Encounter (Signed)
Dr. Barry Dienes agreed to prescribe Ativan 1mg  po q 12 hours prn anxiety.  Pt's daughter made aware this will be called in to [facility].

## 2014-01-30 NOTE — Telephone Encounter (Signed)
Pt's daughter called in this afternoon.  She states that mom was very anxious about the cancer.  She said the pt is not eating, not sleeping, is worried about having cancer and that she is going to die.  She stated that the pt is worried about having the surgery.  She wants to see if Dr. Barry Dienes could give her something to help with this anxiety and give pt some relief.  Katha Cabal states that pt has never taken anything for anxiety but feels that this could be beneficial with the current situation.  Please advise!  Anderson Malta

## 2014-01-30 NOTE — Discharge Summary (Signed)
Physician Discharge Summary  Patient ID: Shelby Hood MRN: [id number] DOB/AGE: 1941-04-11 73 y.o.  Admit date: 01/29/2014 Discharge date: 01/30/2014  Admission Diagnoses: Hepatocellular carcinoma  Discharge Diagnoses: Hepatocellular carcinoma, s/p successful extended right portal venous embolization with embolization of all branches of the right portal venous system in addition to the segment 4 branch of the left portal venous system on 01/29/2014 via IV conscious sedation Active Problems:   Hepatic cancer   HCC (hepatocellular carcinoma)  Past Medical History  Diagnosis Date  . Osteoarthritis   . Hypertension   . Hypertriglyceridemia   . History of breast cancer 02/22/2012  . Breast cancer 01/2009  . 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 Stark Klein for evaluation of her newly diagnosed hepatocellular carcinoma. She was seen in consultation by Dr. Jacqulynn Cadet on 01/28/14 to discuss portal vein embolization prior to planned surgical resection of the hepatocellular carcinoma. Patient has a known 22 cm centrally necrotic right hepatic mass consistent with hepatocellular carcinoma which completely encompasses hepatic segments 6 and 5. There is significant intra-tumoral portal venous or arterial venous shunting and a few small satellite lesions identified in hepatic segment 4A and 4B. Patient has no distant metastatic disease or extrahepatic metastatic disease. Preoperative embolization of the right and medial segment left portal veins was undertaken to offer potential benefits of hypertrophy of the functional liver remnant, decreased tumor vascularity and slightly decreased size of the residual normal right hepatic parenchyma. On 01/29/2014 the patient underwent elective successful extended right portal  venous embolization with embolization of all branches of the right portal venous system in addition to the segment 4 branch of the left portal venous system. The procedure was performed with IV conscious sedation. Portal venous pressure measurement was 10 mm mercury. The patient tolerated the procedure well and was admitted to the hospital for overnight observation. Patient's overnight stay was unremarkable with exception of some mild  nausea and minimal epigastric discomfort.On the day of discharge the patient was able to void and ambulate without difficulty. She tolerated her diet well although she does have a chronic history of intermittent nausea with eating. Followup CBC and CMP were stable with exception of mild leukocyotsis as expected post embolization. The patient was seen by Dr.Yamagata and deemed stable for discharge. She is scheduled to undergo follow up CT scan in 4 weeks followed by planned extended right hepatectomy by Dr. Barry Dienes. Patient was given a prescription for Zofran 4 mg by mouth every 8 hours as needed for nausea (#30 with one refill). She was told to contact the IR service or Dr. Barry Dienes with any additional questions or concerns.  Consults: none  Significant Diagnostic Studies:  Results for orders placed during the hospital encounter of 01/29/14  APTT      Result Value Ref Range   aPTT 27  24 - 37 seconds  CBC WITH DIFFERENTIAL      Result Value Ref Range   WBC 9.3  4.0 - 10.5 K/uL   RBC 3.95  3.87 - 5.11 MIL/uL   Hemoglobin 11.7 (*) 12.0 - 15.0 g/dL   HCT 35.6 (*) 36.0 - 46.0 %   MCV 90.1  78.0 - 100.0 fL   MCH 29.6  26.0 - 34.0 pg   MCHC 32.9  30.0 - 36.0 g/dL  RDW 13.3  11.5 - 15.5 %   Platelets 419 (*) 150 - 400 K/uL   Neutrophils Relative % 74  43 - 77 %   Neutro Abs 7.0  1.7 - 7.7 K/uL   Lymphocytes Relative 11 (*) 12 - 46 %   Lymphs Abs 1.0  0.7 - 4.0 K/uL   Monocytes Relative 8  3 - 12 %   Monocytes Absolute 0.7  0.1 - 1.0 K/uL   Eosinophils Relative 6 (*) 0  - 5 %   Eosinophils Absolute 0.6  0.0 - 0.7 K/uL   Basophils Relative 1  0 - 1 %   Basophils Absolute 0.1  0.0 - 0.1 K/uL  COMPREHENSIVE METABOLIC PANEL      Result Value Ref Range   Sodium 135 (*) 137 - 147 mEq/L   Potassium 4.3  3.7 - 5.3 mEq/L   Chloride 98  96 - 112 mEq/L   CO2 24  19 - 32 mEq/L   Glucose, Bld 98  70 - 99 mg/dL   BUN 13  6 - 23 mg/dL   Creatinine, Ser 0.75  0.50 - 1.10 mg/dL   Calcium 9.6  8.4 - 10.5 mg/dL   Total Protein 6.7  6.0 - 8.3 g/dL   Albumin 3.2 (*) 3.5 - 5.2 g/dL   AST 69 (*) 0 - 37 U/L   ALT 34  0 - 35 U/L   Alkaline Phosphatase 103  39 - 117 U/L   Total Bilirubin 0.6  0.3 - 1.2 mg/dL   GFR calc non Af Amer 83 (*) >90 mL/min   GFR calc Af Amer >90  >90 mL/min   Anion gap 13  5 - 15  PROTIME-INR      Result Value Ref Range   Prothrombin Time 13.5  11.6 - 15.2 seconds   INR 1.03  0.00 - 1.49  CBC      Result Value Ref Range   WBC 13.7 (*) 4.0 - 10.5 K/uL   RBC 3.57 (*) 3.87 - 5.11 MIL/uL   Hemoglobin 10.4 (*) 12.0 - 15.0 g/dL   HCT 32.4 (*) 36.0 - 46.0 %   MCV 90.8  78.0 - 100.0 fL   MCH 29.1  26.0 - 34.0 pg   MCHC 32.1  30.0 - 36.0 g/dL   RDW 13.3  11.5 - 15.5 %   Platelets 388  150 - 400 K/uL  COMPREHENSIVE METABOLIC PANEL      Result Value Ref Range   Sodium 133 (*) 137 - 147 mEq/L   Potassium 4.3  3.7 - 5.3 mEq/L   Chloride 96  96 - 112 mEq/L   CO2 24  19 - 32 mEq/L   Glucose, Bld 104 (*) 70 - 99 mg/dL   BUN 10  6 - 23 mg/dL   Creatinine, Ser 0.78  0.50 - 1.10 mg/dL   Calcium 9.0  8.4 - 10.5 mg/dL   Total Protein 5.6 (*) 6.0 - 8.3 g/dL   Albumin 2.8 (*) 3.5 - 5.2 g/dL   AST 87 (*) 0 - 37 U/L   ALT 42 (*) 0 - 35 U/L   Alkaline Phosphatase 102  39 - 117 U/L   Total Bilirubin 0.6  0.3 - 1.2 mg/dL   GFR calc non Af Amer 82 (*) >90 mL/min   GFR calc Af Amer >90  >90 mL/min   Anion gap 13  5 - 15  PROTIME-INR      Result Value Ref Range  Prothrombin Time 14.6  11.6 - 15.2 seconds   INR 1.14  0.00 - 1.49     Treatments: Ct  Abdomen Pelvis W Contrast  01/07/2014   CLINICAL DATA:  Right lower quadrant abdominal pain, weight loss and decreased appetite. History of bilateral breast cancer.  EXAM: CT ABDOMEN AND PELVIS WITH CONTRAST  TECHNIQUE: Multidetector CT imaging of the abdomen and pelvis was performed using the standard protocol following bolus administration of intravenous contrast.  CONTRAST:  112mL OMNIPAQUE IOHEXOL 300 MG/ML  SOLN  COMPARISON:  None.  FINDINGS: The lung bases demonstrate a calcified granuloma in the right lower lobe and calcified infrahilar lymph nodes on the right. No worrisome pulmonary nodules or pleural effusion. The heart is normal in size. No pericardial effusion.  Large necrotic appearing mass occupying the right hepatic lobe of the liver with extensive vascular enhancement. This is most likely metastatic disease. There are smaller lesions noted in the upper aspect of the right hepatic lobe and in the medial segment of the left hepatic lobe.  The gallbladder is displaced and compressed but is otherwise normal. No common bile duct dilatation. The pancreas is unremarkable. Mild mass effect on the head of the pancreas. The spleen is normal in size. No splenic lesions. The adrenal glands and kidneys are unremarkable. There is mild mass effect on the right kidney which is displaced posteriorly.  The stomach, duodenum, small bowel and colon are grossly normal. No inflammatory changes, mass lesions or obstructive findings. No mesenteric or retroperitoneal mass or lymphadenopathy. The aorta and branch vessels are patent. Moderate atherosclerotic calcifications distally. The major venous structures are patent. The portal and hepatic veins are patent.  A calcified uterine fibroid is noted. Both ovaries are normal. No pelvic mass, adenopathy or free pelvic fluid collections. No inguinal mass or adenopathy.  The bony structures are intact. No findings to suggest bony metastatic disease. There is advanced facet  disease noted in the lumbar spine.  IMPRESSION: 1. 22 cm necrotic right hepatic lobe mass likely accounting for the patient's symptoms. There are other smaller metastatic lesions in the liver. Recommend image guided biopsy. 2. No abdominal/pelvic lymphadenopathy. 3. No findings for osseous metastatic disease. 4. Remote granulomatous disease involving the right lung.   Electronically Signed   By: Kalman Jewels M.D.   On: 01/07/2014 17:06   Ir Angiogram Selective Each Additional Vessel  01/29/2014   CLINICAL DATA:  73 year old female with a dominant right sided hepatocellular carcinoma and several small satellite nodules in the medial segment of the left hepatic lobe. She has noted no new history of cirrhosis and presents for extended right portal vein embolization prior to planned surgical resection.  EXAM: TRANSCATHETER THERAPY EMBOLIZATION; ADDITIONAL ARTERIOGRAPHY; PORTAL VENOGRAPHY; IR ULTRASOUND GUIDANCE VASC ACCESS LEFT  Date: 01/29/2014  PROCEDURE: 1. Ultrasound-guided transhepatic portal venous access 2. Percutaneous transhepatic portal venogram with pressure measurements 3. Catheterization of a small accessory segment 7 portal vein with portal venogram 4. Combined coil and particle embolization of the segment 7 accessory portal vein 5. Catheterization of the posterior division of the right portal vein with portal venogram 6. Particle embolization of the posterior division of the right portal vein 7. Catheterization of the segment 7 portal vein with venogram 8. Coil embolization of the segment 7 vein 9. Catheterization of the segment 6 portal vein with venogram 10. Coil embolization of the segment 7 vein 11. Catheterization of the segment 8 portal vein with a venogram 12. Combined coil and particle embolization of the segment  8 portal vein 13. Catheterization of the segment 5 portal vein with venogram 14. Combined coil and particle embolization of the segment 5 portal vein 15. Catheterization of the segment  for portal vein with venogram 16. Combined coil and particle embolization of the segment for portal vein 17. Gel-Foam embolization of the transhepatic tract Interventional Radiologist:  Criselda Peaches, MD  ANESTHESIA/SEDATION: Moderate (conscious) sedation was used. Six mg Versed, 300 mcg Fentanyl were administered intravenously. The patient's vital signs were monitored continuously by radiology nursing throughout the procedure.  Additionally, 8 mg Zofran and 3.375 g Zosyn were administered intravenously.  Sedation Time: 195 minutes  FLUOROSCOPY TIME:  45 minutes 54 seconds  CONTRAST:  270 cc Omnipaque 300  TECHNIQUE: Informed consent was obtained from the patient following explanation of the procedure, risks, benefits and alternatives. The patient understands, agrees and consents for the procedure. All questions were addressed. A time out was performed.  Maximal barrier sterile technique utilized including caps, mask, sterile gowns, sterile gloves, large sterile drape, hand hygiene, and Betadine skin prep.  The left upper quadrant was interrogated with ultrasound. A branch of the segment 3 portal vein was identified. Local anesthesia was attained by infiltration with 1% lidocaine. A small dermatotomy was made. Under real-time sonographic guidance, a 21 gauge Chiba needle was used to puncture the peripheral radicle of the segment 3 portal vein. A 0.018 wire was advanced into the portal venous system. The inner dilator from a [place] he prima axis set was advanced over the wire. A limited portal venogram was performed confirming portal venous access. The full a prima access sheath was then advanced over the wire into the left portal vein. Through this, a short Amplatz wire was advanced in the portal vein in the [place] a prima access sheath exchanged for a working 5 [place] vascular sheath. An angled catheter and Glidewire were subsequently used to navigate into the main portal vein. A pigtail catheter was advanced in  the main portal vein. The portal venous pressure was then measured. The mean a portal venous pressure is 10 mm of mercury which is top-normal but not clinically significant.  Portal venography was then performed in multiple obliquities to outline the patient's anatomy. There is a small accessory portal vein which appears to supply a portion of segment 7. Otherwise, the portal venous anatomy appears fairly classic. The pigtail catheter was exchanged for an angled Kumpe catheter. The catheter was used to select the accessory segment 7 of portal vein. Venography was performed confirming location. The vessel was then coil embolized in stepwise fashion using first 100- 300 micron Embospheres followed by 300 - 500 and finally 500 - 700 micron Embospheres. Once near stasis was attained, the vessel origin was coil embolized with a single 6 x 10 mm interlock 0.035 coil.  The Kumpe catheter was next used to select the posterior division of the right hepatic vein. Venography was performed confirming vessel location. There are 2 dominant branch vessels. The micro catheter was positioned at the common trunk of the posterior division and the common trunk was embolized ni stepwise fashion with particles as described above. Near stasis was attained. The 5 French catheter was used to select each of the branch vessels and these were coil embolized using a combination of 6 and 8 mm interlock coils. Two coils were used on the more inferior branch and a single coil used on the more superior branch. The 5 French catheter was then navigated into the common trunk of the  anterior division which divides into the segment 8 and segment 5 branches. The catheter was advanced into the segment 8 branch. Stepwise particle embolization was performed to near stasis followed by coil embolization using a 10 x 40 cm interlock coil. Unfortunately, the coil pack partially occluded the segment 5 branch origin.  However using careful probing with a  glidewire, the Kumpe the catheter was ultimately navigated through the coil pack and into the segment 5 portal vein. Venography confirmed vessel location and anatomy. This vessel was also particle embolized thin stepwise fashion to near stasis. The vessel was then coil embolized using a 10 x 20 mm interlock coil followed by two 15 x 40 mm interlock coils which were allowed to extend into the common trunk.  A post embolization portogram was performed. There is no flow into any of the right-sided hepatic veins. Reflux of contrast material can be seen flowing into a large vessel which joins the inferior aspect of the right main portal vein. This appears to be the primary venous drainage of the large right pad 0 cellular carcinoma consistent with the large volume of arterial portal shunting demonstrated on the prior CT scan. A copy catheter was drawn back into the left portal vein and portal venography was performed. The segment 4 branch was identified. The micro catheter was used to advanced into the segment 4 branch which was then particle embolized stepwise fashion. Finally, this vessel was coil embolized with a for by 10 interlock coil.  The Kumpe the catheter was removed. The sheath was drawn back while gently puffing contrast until the sheath was within the hepatic tract outside of the portal vein. A copy catheter was reintroduced to the tip of the sheath. As the sheath was pulled back, several Gel-Foam core P does were injected through the 5 French catheter into the transhepatic tract to provide hemostasis. The catheter and sheath were removed. Pressure was held for approximately 5 min. The patient tolerated the procedure very well.  COMPLICATIONS: None immediate.  IMPRESSION: 1. Transhepatic portal venous pressure measurements demonstrated portal venous pressure of 10 mm of mercury which is top-normal but not clinically significant. 2. Successful extended right portal venous embolization with embolization of all  branches of the right portal venous system in addition to the segment 4 branch of the left portal venous system. Patient will undergo follow-up CT scan in 4 weeks followed by planned extended right hepatectomy by Dr. Barry Dienes.  Signed,  Criselda Peaches, MD  [facility]  [facility]   Electronically Signed   By: Jacqulynn Cadet M.D.   On: 01/29/2014 17:39   Ir Angiogram Selective Each Additional Vessel  01/29/2014   CLINICAL DATA:  73 year old female with a dominant right sided hepatocellular carcinoma and several small satellite nodules in the medial segment of the left hepatic lobe. She has noted no new history of cirrhosis and presents for extended right portal vein embolization prior to planned surgical resection.  EXAM: TRANSCATHETER THERAPY EMBOLIZATION; ADDITIONAL ARTERIOGRAPHY; PORTAL VENOGRAPHY; IR ULTRASOUND GUIDANCE VASC ACCESS LEFT  Date: 01/29/2014  PROCEDURE: 1. Ultrasound-guided transhepatic portal venous access 2. Percutaneous transhepatic portal venogram with pressure measurements 3. Catheterization of a small accessory segment 7 portal vein with portal venogram 4. Combined coil and particle embolization of the segment 7 accessory portal vein 5. Catheterization of the posterior division of the right portal vein with portal venogram 6. Particle embolization of the posterior division of the right portal vein 7. Catheterization of the segment  7 portal vein with venogram 8. Coil embolization of the segment 7 vein 9. Catheterization of the segment 6 portal vein with venogram 10. Coil embolization of the segment 7 vein 11. Catheterization of the segment 8 portal vein with a venogram 12. Combined coil and particle embolization of the segment 8 portal vein 13. Catheterization of the segment 5 portal vein with venogram 14. Combined coil and particle embolization of the segment 5 portal vein 15. Catheterization of the segment for portal vein with venogram 16.  Combined coil and particle embolization of the segment for portal vein 17. Gel-Foam embolization of the transhepatic tract Interventional Radiologist:  Criselda Peaches, MD  ANESTHESIA/SEDATION: Moderate (conscious) sedation was used. Six mg Versed, 300 mcg Fentanyl were administered intravenously. The patient's vital signs were monitored continuously by radiology nursing throughout the procedure.  Additionally, 8 mg Zofran and 3.375 g Zosyn were administered intravenously.  Sedation Time: 195 minutes  FLUOROSCOPY TIME:  45 minutes 54 seconds  CONTRAST:  270 cc Omnipaque 300  TECHNIQUE: Informed consent was obtained from the patient following explanation of the procedure, risks, benefits and alternatives. The patient understands, agrees and consents for the procedure. All questions were addressed. A time out was performed.  Maximal barrier sterile technique utilized including caps, mask, sterile gowns, sterile gloves, large sterile drape, hand hygiene, and Betadine skin prep.  The left upper quadrant was interrogated with ultrasound. A branch of the segment 3 portal vein was identified. Local anesthesia was attained by infiltration with 1% lidocaine. A small dermatotomy was made. Under real-time sonographic guidance, a 21 gauge Chiba needle was used to puncture the peripheral radicle of the segment 3 portal vein. A 0.018 wire was advanced into the portal venous system. The inner dilator from a [place] he prima axis set was advanced over the wire. A limited portal venogram was performed confirming portal venous access. The full a prima access sheath was then advanced over the wire into the left portal vein. Through this, a short Amplatz wire was advanced in the portal vein in the [place] a prima access sheath exchanged for a working 5 [place] vascular sheath. An angled catheter and Glidewire were subsequently used to navigate into the main portal vein. A pigtail catheter was advanced in the main portal vein. The portal  venous pressure was then measured. The mean a portal venous pressure is 10 mm of mercury which is top-normal but not clinically significant.  Portal venography was then performed in multiple obliquities to outline the patient's anatomy. There is a small accessory portal vein which appears to supply a portion of segment 7. Otherwise, the portal venous anatomy appears fairly classic. The pigtail catheter was exchanged for an angled Kumpe catheter. The catheter was used to select the accessory segment 7 of portal vein. Venography was performed confirming location. The vessel was then coil embolized in stepwise fashion using first 100- 300 micron Embospheres followed by 300 - 500 and finally 500 - 700 micron Embospheres. Once near stasis was attained, the vessel origin was coil embolized with a single 6 x 10 mm interlock 0.035 coil.  The Kumpe catheter was next used to select the posterior division of the right hepatic vein. Venography was performed confirming vessel location. There are 2 dominant branch vessels. The micro catheter was positioned at the common trunk of the posterior division and the common trunk was embolized ni stepwise fashion with particles as described above. Near stasis was attained. The 5 French catheter was used to select each  of the branch vessels and these were coil embolized using a combination of 6 and 8 mm interlock coils. Two coils were used on the more inferior branch and a single coil used on the more superior branch. The 5 French catheter was then navigated into the common trunk of the anterior division which divides into the segment 8 and segment 5 branches. The catheter was advanced into the segment 8 branch. Stepwise particle embolization was performed to near stasis followed by coil embolization using a 10 x 40 cm interlock coil. Unfortunately, the coil pack partially occluded the segment 5 branch origin.  However using careful probing with a glidewire, the Kumpe the catheter was  ultimately navigated through the coil pack and into the segment 5 portal vein. Venography confirmed vessel location and anatomy. This vessel was also particle embolized thin stepwise fashion to near stasis. The vessel was then coil embolized using a 10 x 20 mm interlock coil followed by two 15 x 40 mm interlock coils which were allowed to extend into the common trunk.  A post embolization portogram was performed. There is no flow into any of the right-sided hepatic veins. Reflux of contrast material can be seen flowing into a large vessel which joins the inferior aspect of the right main portal vein. This appears to be the primary venous drainage of the large right pad 0 cellular carcinoma consistent with the large volume of arterial portal shunting demonstrated on the prior CT scan. A copy catheter was drawn back into the left portal vein and portal venography was performed. The segment 4 branch was identified. The micro catheter was used to advanced into the segment 4 branch which was then particle embolized stepwise fashion. Finally, this vessel was coil embolized with a for by 10 interlock coil.  The Kumpe the catheter was removed. The sheath was drawn back while gently puffing contrast until the sheath was within the hepatic tract outside of the portal vein. A copy catheter was reintroduced to the tip of the sheath. As the sheath was pulled back, several Gel-Foam core P does were injected through the 5 French catheter into the transhepatic tract to provide hemostasis. The catheter and sheath were removed. Pressure was held for approximately 5 min. The patient tolerated the procedure very well.  COMPLICATIONS: None immediate.  IMPRESSION: 1. Transhepatic portal venous pressure measurements demonstrated portal venous pressure of 10 mm of mercury which is top-normal but not clinically significant. 2. Successful extended right portal venous embolization with embolization of all branches of the right portal venous  system in addition to the segment 4 branch of the left portal venous system. Patient will undergo follow-up CT scan in 4 weeks followed by planned extended right hepatectomy by Dr. Barry Dienes.  Signed,  Criselda Peaches, MD  [facility]  [facility]   Electronically Signed   By: Jacqulynn Cadet M.D.   On: 01/29/2014 17:39   Ir Angiogram Selective Each Additional Vessel  01/29/2014   CLINICAL DATA:  73 year old female with a dominant right sided hepatocellular carcinoma and several small satellite nodules in the medial segment of the left hepatic lobe. She has noted no new history of cirrhosis and presents for extended right portal vein embolization prior to planned surgical resection.  EXAM: TRANSCATHETER THERAPY EMBOLIZATION; ADDITIONAL ARTERIOGRAPHY; PORTAL VENOGRAPHY; IR ULTRASOUND GUIDANCE VASC ACCESS LEFT  Date: 01/29/2014  PROCEDURE: 1. Ultrasound-guided transhepatic portal venous access 2. Percutaneous transhepatic portal venogram with pressure measurements 3. Catheterization of a small accessory  segment 7 portal vein with portal venogram 4. Combined coil and particle embolization of the segment 7 accessory portal vein 5. Catheterization of the posterior division of the right portal vein with portal venogram 6. Particle embolization of the posterior division of the right portal vein 7. Catheterization of the segment 7 portal vein with venogram 8. Coil embolization of the segment 7 vein 9. Catheterization of the segment 6 portal vein with venogram 10. Coil embolization of the segment 7 vein 11. Catheterization of the segment 8 portal vein with a venogram 12. Combined coil and particle embolization of the segment 8 portal vein 13. Catheterization of the segment 5 portal vein with venogram 14. Combined coil and particle embolization of the segment 5 portal vein 15. Catheterization of the segment for portal vein with venogram 16. Combined coil and particle  embolization of the segment for portal vein 17. Gel-Foam embolization of the transhepatic tract Interventional Radiologist:  Criselda Peaches, MD  ANESTHESIA/SEDATION: Moderate (conscious) sedation was used. Six mg Versed, 300 mcg Fentanyl were administered intravenously. The patient's vital signs were monitored continuously by radiology nursing throughout the procedure.  Additionally, 8 mg Zofran and 3.375 g Zosyn were administered intravenously.  Sedation Time: 195 minutes  FLUOROSCOPY TIME:  45 minutes 54 seconds  CONTRAST:  270 cc Omnipaque 300  TECHNIQUE: Informed consent was obtained from the patient following explanation of the procedure, risks, benefits and alternatives. The patient understands, agrees and consents for the procedure. All questions were addressed. A time out was performed.  Maximal barrier sterile technique utilized including caps, mask, sterile gowns, sterile gloves, large sterile drape, hand hygiene, and Betadine skin prep.  The left upper quadrant was interrogated with ultrasound. A branch of the segment 3 portal vein was identified. Local anesthesia was attained by infiltration with 1% lidocaine. A small dermatotomy was made. Under real-time sonographic guidance, a 21 gauge Chiba needle was used to puncture the peripheral radicle of the segment 3 portal vein. A 0.018 wire was advanced into the portal venous system. The inner dilator from a [place] he prima axis set was advanced over the wire. A limited portal venogram was performed confirming portal venous access. The full a prima access sheath was then advanced over the wire into the left portal vein. Through this, a short Amplatz wire was advanced in the portal vein in the [place] a prima access sheath exchanged for a working 5 [place] vascular sheath. An angled catheter and Glidewire were subsequently used to navigate into the main portal vein. A pigtail catheter was advanced in the main portal vein. The portal venous pressure was then  measured. The mean a portal venous pressure is 10 mm of mercury which is top-normal but not clinically significant.  Portal venography was then performed in multiple obliquities to outline the patient's anatomy. There is a small accessory portal vein which appears to supply a portion of segment 7. Otherwise, the portal venous anatomy appears fairly classic. The pigtail catheter was exchanged for an angled Kumpe catheter. The catheter was used to select the accessory segment 7 of portal vein. Venography was performed confirming location. The vessel was then coil embolized in stepwise fashion using first 100- 300 micron Embospheres followed by 300 - 500 and finally 500 - 700 micron Embospheres. Once near stasis was attained, the vessel origin was coil embolized with a single 6 x 10 mm interlock 0.035 coil.  The Kumpe catheter was next used to select the posterior division of the right hepatic vein. Venography  was performed confirming vessel location. There are 2 dominant branch vessels. The micro catheter was positioned at the common trunk of the posterior division and the common trunk was embolized ni stepwise fashion with particles as described above. Near stasis was attained. The 5 French catheter was used to select each of the branch vessels and these were coil embolized using a combination of 6 and 8 mm interlock coils. Two coils were used on the more inferior branch and a single coil used on the more superior branch. The 5 French catheter was then navigated into the common trunk of the anterior division which divides into the segment 8 and segment 5 branches. The catheter was advanced into the segment 8 branch. Stepwise particle embolization was performed to near stasis followed by coil embolization using a 10 x 40 cm interlock coil. Unfortunately, the coil pack partially occluded the segment 5 branch origin.  However using careful probing with a glidewire, the Kumpe the catheter was ultimately navigated through  the coil pack and into the segment 5 portal vein. Venography confirmed vessel location and anatomy. This vessel was also particle embolized thin stepwise fashion to near stasis. The vessel was then coil embolized using a 10 x 20 mm interlock coil followed by two 15 x 40 mm interlock coils which were allowed to extend into the common trunk.  A post embolization portogram was performed. There is no flow into any of the right-sided hepatic veins. Reflux of contrast material can be seen flowing into a large vessel which joins the inferior aspect of the right main portal vein. This appears to be the primary venous drainage of the large right pad 0 cellular carcinoma consistent with the large volume of arterial portal shunting demonstrated on the prior CT scan. A copy catheter was drawn back into the left portal vein and portal venography was performed. The segment 4 branch was identified. The micro catheter was used to advanced into the segment 4 branch which was then particle embolized stepwise fashion. Finally, this vessel was coil embolized with a for by 10 interlock coil.  The Kumpe the catheter was removed. The sheath was drawn back while gently puffing contrast until the sheath was within the hepatic tract outside of the portal vein. A copy catheter was reintroduced to the tip of the sheath. As the sheath was pulled back, several Gel-Foam core P does were injected through the 5 French catheter into the transhepatic tract to provide hemostasis. The catheter and sheath were removed. Pressure was held for approximately 5 min. The patient tolerated the procedure very well.  COMPLICATIONS: None immediate.  IMPRESSION: 1. Transhepatic portal venous pressure measurements demonstrated portal venous pressure of 10 mm of mercury which is top-normal but not clinically significant. 2. Successful extended right portal venous embolization with embolization of all branches of the right portal venous system in addition to the  segment 4 branch of the left portal venous system. Patient will undergo follow-up CT scan in 4 weeks followed by planned extended right hepatectomy by Dr. Barry Dienes.  Signed,  Criselda Peaches, MD  [facility]  [facility]   Electronically Signed   By: Jacqulynn Cadet M.D.   On: 01/29/2014 17:39   Ir Angiogram Selective Each Additional Vessel  01/29/2014   CLINICAL DATA:  73 year old female with a dominant right sided hepatocellular carcinoma and several small satellite nodules in the medial segment of the left hepatic lobe. She has noted no new history of cirrhosis and presents  for extended right portal vein embolization prior to planned surgical resection.  EXAM: TRANSCATHETER THERAPY EMBOLIZATION; ADDITIONAL ARTERIOGRAPHY; PORTAL VENOGRAPHY; IR ULTRASOUND GUIDANCE VASC ACCESS LEFT  Date: 01/29/2014  PROCEDURE: 1. Ultrasound-guided transhepatic portal venous access 2. Percutaneous transhepatic portal venogram with pressure measurements 3. Catheterization of a small accessory segment 7 portal vein with portal venogram 4. Combined coil and particle embolization of the segment 7 accessory portal vein 5. Catheterization of the posterior division of the right portal vein with portal venogram 6. Particle embolization of the posterior division of the right portal vein 7. Catheterization of the segment 7 portal vein with venogram 8. Coil embolization of the segment 7 vein 9. Catheterization of the segment 6 portal vein with venogram 10. Coil embolization of the segment 7 vein 11. Catheterization of the segment 8 portal vein with a venogram 12. Combined coil and particle embolization of the segment 8 portal vein 13. Catheterization of the segment 5 portal vein with venogram 14. Combined coil and particle embolization of the segment 5 portal vein 15. Catheterization of the segment for portal vein with venogram 16. Combined coil and particle embolization of the segment for  portal vein 17. Gel-Foam embolization of the transhepatic tract Interventional Radiologist:  Criselda Peaches, MD  ANESTHESIA/SEDATION: Moderate (conscious) sedation was used. Six mg Versed, 300 mcg Fentanyl were administered intravenously. The patient's vital signs were monitored continuously by radiology nursing throughout the procedure.  Additionally, 8 mg Zofran and 3.375 g Zosyn were administered intravenously.  Sedation Time: 195 minutes  FLUOROSCOPY TIME:  45 minutes 54 seconds  CONTRAST:  270 cc Omnipaque 300  TECHNIQUE: Informed consent was obtained from the patient following explanation of the procedure, risks, benefits and alternatives. The patient understands, agrees and consents for the procedure. All questions were addressed. A time out was performed.  Maximal barrier sterile technique utilized including caps, mask, sterile gowns, sterile gloves, large sterile drape, hand hygiene, and Betadine skin prep.  The left upper quadrant was interrogated with ultrasound. A branch of the segment 3 portal vein was identified. Local anesthesia was attained by infiltration with 1% lidocaine. A small dermatotomy was made. Under real-time sonographic guidance, a 21 gauge Chiba needle was used to puncture the peripheral radicle of the segment 3 portal vein. A 0.018 wire was advanced into the portal venous system. The inner dilator from a [place] he prima axis set was advanced over the wire. A limited portal venogram was performed confirming portal venous access. The full a prima access sheath was then advanced over the wire into the left portal vein. Through this, a short Amplatz wire was advanced in the portal vein in the [place] a prima access sheath exchanged for a working 5 [place] vascular sheath. An angled catheter and Glidewire were subsequently used to navigate into the main portal vein. A pigtail catheter was advanced in the main portal vein. The portal venous pressure was then measured. The mean a portal venous  pressure is 10 mm of mercury which is top-normal but not clinically significant.  Portal venography was then performed in multiple obliquities to outline the patient's anatomy. There is a small accessory portal vein which appears to supply a portion of segment 7. Otherwise, the portal venous anatomy appears fairly classic. The pigtail catheter was exchanged for an angled Kumpe catheter. The catheter was used to select the accessory segment 7 of portal vein. Venography was performed confirming location. The vessel was then coil embolized in stepwise fashion using first 100- 300 micron Embospheres  followed by 300 - 500 and finally 500 - 700 micron Embospheres. Once near stasis was attained, the vessel origin was coil embolized with a single 6 x 10 mm interlock 0.035 coil.  The Kumpe catheter was next used to select the posterior division of the right hepatic vein. Venography was performed confirming vessel location. There are 2 dominant branch vessels. The micro catheter was positioned at the common trunk of the posterior division and the common trunk was embolized ni stepwise fashion with particles as described above. Near stasis was attained. The 5 French catheter was used to select each of the branch vessels and these were coil embolized using a combination of 6 and 8 mm interlock coils. Two coils were used on the more inferior branch and a single coil used on the more superior branch. The 5 French catheter was then navigated into the common trunk of the anterior division which divides into the segment 8 and segment 5 branches. The catheter was advanced into the segment 8 branch. Stepwise particle embolization was performed to near stasis followed by coil embolization using a 10 x 40 cm interlock coil. Unfortunately, the coil pack partially occluded the segment 5 branch origin.  However using careful probing with a glidewire, the Kumpe the catheter was ultimately navigated through the coil pack and into the segment  5 portal vein. Venography confirmed vessel location and anatomy. This vessel was also particle embolized thin stepwise fashion to near stasis. The vessel was then coil embolized using a 10 x 20 mm interlock coil followed by two 15 x 40 mm interlock coils which were allowed to extend into the common trunk.  A post embolization portogram was performed. There is no flow into any of the right-sided hepatic veins. Reflux of contrast material can be seen flowing into a large vessel which joins the inferior aspect of the right main portal vein. This appears to be the primary venous drainage of the large right pad 0 cellular carcinoma consistent with the large volume of arterial portal shunting demonstrated on the prior CT scan. A copy catheter was drawn back into the left portal vein and portal venography was performed. The segment 4 branch was identified. The micro catheter was used to advanced into the segment 4 branch which was then particle embolized stepwise fashion. Finally, this vessel was coil embolized with a for by 10 interlock coil.  The Kumpe the catheter was removed. The sheath was drawn back while gently puffing contrast until the sheath was within the hepatic tract outside of the portal vein. A copy catheter was reintroduced to the tip of the sheath. As the sheath was pulled back, several Gel-Foam core P does were injected through the 5 French catheter into the transhepatic tract to provide hemostasis. The catheter and sheath were removed. Pressure was held for approximately 5 min. The patient tolerated the procedure very well.  COMPLICATIONS: None immediate.  IMPRESSION: 1. Transhepatic portal venous pressure measurements demonstrated portal venous pressure of 10 mm of mercury which is top-normal but not clinically significant. 2. Successful extended right portal venous embolization with embolization of all branches of the right portal venous system in addition to the segment 4 branch of the left portal  venous system. Patient will undergo follow-up CT scan in 4 weeks followed by planned extended right hepatectomy by Dr. Barry Dienes.  Signed,  Criselda Peaches, MD  [facility]  [facility]   Electronically Signed   By: Dellis Filbert.D.  On: 01/29/2014 17:39   Ir Angiogram Selective Each Additional Vessel  01/29/2014   CLINICAL DATA:  73 year old female with a dominant right sided hepatocellular carcinoma and several small satellite nodules in the medial segment of the left hepatic lobe. She has noted no new history of cirrhosis and presents for extended right portal vein embolization prior to planned surgical resection.  EXAM: TRANSCATHETER THERAPY EMBOLIZATION; ADDITIONAL ARTERIOGRAPHY; PORTAL VENOGRAPHY; IR ULTRASOUND GUIDANCE VASC ACCESS LEFT  Date: 01/29/2014  PROCEDURE: 1. Ultrasound-guided transhepatic portal venous access 2. Percutaneous transhepatic portal venogram with pressure measurements 3. Catheterization of a small accessory segment 7 portal vein with portal venogram 4. Combined coil and particle embolization of the segment 7 accessory portal vein 5. Catheterization of the posterior division of the right portal vein with portal venogram 6. Particle embolization of the posterior division of the right portal vein 7. Catheterization of the segment 7 portal vein with venogram 8. Coil embolization of the segment 7 vein 9. Catheterization of the segment 6 portal vein with venogram 10. Coil embolization of the segment 7 vein 11. Catheterization of the segment 8 portal vein with a venogram 12. Combined coil and particle embolization of the segment 8 portal vein 13. Catheterization of the segment 5 portal vein with venogram 14. Combined coil and particle embolization of the segment 5 portal vein 15. Catheterization of the segment for portal vein with venogram 16. Combined coil and particle embolization of the segment for portal vein 17. Gel-Foam embolization  of the transhepatic tract Interventional Radiologist:  Criselda Peaches, MD  ANESTHESIA/SEDATION: Moderate (conscious) sedation was used. Six mg Versed, 300 mcg Fentanyl were administered intravenously. The patient's vital signs were monitored continuously by radiology nursing throughout the procedure.  Additionally, 8 mg Zofran and 3.375 g Zosyn were administered intravenously.  Sedation Time: 195 minutes  FLUOROSCOPY TIME:  45 minutes 54 seconds  CONTRAST:  270 cc Omnipaque 300  TECHNIQUE: Informed consent was obtained from the patient following explanation of the procedure, risks, benefits and alternatives. The patient understands, agrees and consents for the procedure. All questions were addressed. A time out was performed.  Maximal barrier sterile technique utilized including caps, mask, sterile gowns, sterile gloves, large sterile drape, hand hygiene, and Betadine skin prep.  The left upper quadrant was interrogated with ultrasound. A branch of the segment 3 portal vein was identified. Local anesthesia was attained by infiltration with 1% lidocaine. A small dermatotomy was made. Under real-time sonographic guidance, a 21 gauge Chiba needle was used to puncture the peripheral radicle of the segment 3 portal vein. A 0.018 wire was advanced into the portal venous system. The inner dilator from a [place] he prima axis set was advanced over the wire. A limited portal venogram was performed confirming portal venous access. The full a prima access sheath was then advanced over the wire into the left portal vein. Through this, a short Amplatz wire was advanced in the portal vein in the [place] a prima access sheath exchanged for a working 5 [place] vascular sheath. An angled catheter and Glidewire were subsequently used to navigate into the main portal vein. A pigtail catheter was advanced in the main portal vein. The portal venous pressure was then measured. The mean a portal venous pressure is 10 mm of mercury which is  top-normal but not clinically significant.  Portal venography was then performed in multiple obliquities to outline the patient's anatomy. There is a small accessory portal vein which appears to supply a portion of segment 7.  Otherwise, the portal venous anatomy appears fairly classic. The pigtail catheter was exchanged for an angled Kumpe catheter. The catheter was used to select the accessory segment 7 of portal vein. Venography was performed confirming location. The vessel was then coil embolized in stepwise fashion using first 100- 300 micron Embospheres followed by 300 - 500 and finally 500 - 700 micron Embospheres. Once near stasis was attained, the vessel origin was coil embolized with a single 6 x 10 mm interlock 0.035 coil.  The Kumpe catheter was next used to select the posterior division of the right hepatic vein. Venography was performed confirming vessel location. There are 2 dominant branch vessels. The micro catheter was positioned at the common trunk of the posterior division and the common trunk was embolized ni stepwise fashion with particles as described above. Near stasis was attained. The 5 French catheter was used to select each of the branch vessels and these were coil embolized using a combination of 6 and 8 mm interlock coils. Two coils were used on the more inferior branch and a single coil used on the more superior branch. The 5 French catheter was then navigated into the common trunk of the anterior division which divides into the segment 8 and segment 5 branches. The catheter was advanced into the segment 8 branch. Stepwise particle embolization was performed to near stasis followed by coil embolization using a 10 x 40 cm interlock coil. Unfortunately, the coil pack partially occluded the segment 5 branch origin.  However using careful probing with a glidewire, the Kumpe the catheter was ultimately navigated through the coil pack and into the segment 5 portal vein. Venography confirmed  vessel location and anatomy. This vessel was also particle embolized thin stepwise fashion to near stasis. The vessel was then coil embolized using a 10 x 20 mm interlock coil followed by two 15 x 40 mm interlock coils which were allowed to extend into the common trunk.  A post embolization portogram was performed. There is no flow into any of the right-sided hepatic veins. Reflux of contrast material can be seen flowing into a large vessel which joins the inferior aspect of the right main portal vein. This appears to be the primary venous drainage of the large right pad 0 cellular carcinoma consistent with the large volume of arterial portal shunting demonstrated on the prior CT scan. A copy catheter was drawn back into the left portal vein and portal venography was performed. The segment 4 branch was identified. The micro catheter was used to advanced into the segment 4 branch which was then particle embolized stepwise fashion. Finally, this vessel was coil embolized with a for by 10 interlock coil.  The Kumpe the catheter was removed. The sheath was drawn back while gently puffing contrast until the sheath was within the hepatic tract outside of the portal vein. A copy catheter was reintroduced to the tip of the sheath. As the sheath was pulled back, several Gel-Foam core P does were injected through the 5 French catheter into the transhepatic tract to provide hemostasis. The catheter and sheath were removed. Pressure was held for approximately 5 min. The patient tolerated the procedure very well.  COMPLICATIONS: None immediate.  IMPRESSION: 1. Transhepatic portal venous pressure measurements demonstrated portal venous pressure of 10 mm of mercury which is top-normal but not clinically significant. 2. Successful extended right portal venous embolization with embolization of all branches of the right portal venous system in addition to the segment 4 branch of the left  portal venous system. Patient will undergo  follow-up CT scan in 4 weeks followed by planned extended right hepatectomy by Dr. Barry Dienes.  Signed,  Criselda Peaches, MD  [facility]  [facility]   Electronically Signed   By: Jacqulynn Cadet M.D.   On: 01/29/2014 17:39   Ir Transhepatic Portogram W Hemo  01/29/2014   CLINICAL DATA:  74 year old female with a dominant right sided hepatocellular carcinoma and several small satellite nodules in the medial segment of the left hepatic lobe. She has noted no new history of cirrhosis and presents for extended right portal vein embolization prior to planned surgical resection.  EXAM: TRANSCATHETER THERAPY EMBOLIZATION; ADDITIONAL ARTERIOGRAPHY; PORTAL VENOGRAPHY; IR ULTRASOUND GUIDANCE VASC ACCESS LEFT  Date: 01/29/2014  PROCEDURE: 1. Ultrasound-guided transhepatic portal venous access 2. Percutaneous transhepatic portal venogram with pressure measurements 3. Catheterization of a small accessory segment 7 portal vein with portal venogram 4. Combined coil and particle embolization of the segment 7 accessory portal vein 5. Catheterization of the posterior division of the right portal vein with portal venogram 6. Particle embolization of the posterior division of the right portal vein 7. Catheterization of the segment 7 portal vein with venogram 8. Coil embolization of the segment 7 vein 9. Catheterization of the segment 6 portal vein with venogram 10. Coil embolization of the segment 7 vein 11. Catheterization of the segment 8 portal vein with a venogram 12. Combined coil and particle embolization of the segment 8 portal vein 13. Catheterization of the segment 5 portal vein with venogram 14. Combined coil and particle embolization of the segment 5 portal vein 15. Catheterization of the segment for portal vein with venogram 16. Combined coil and particle embolization of the segment for portal vein 17. Gel-Foam embolization of the transhepatic tract Interventional  Radiologist:  Criselda Peaches, MD  ANESTHESIA/SEDATION: Moderate (conscious) sedation was used. Six mg Versed, 300 mcg Fentanyl were administered intravenously. The patient's vital signs were monitored continuously by radiology nursing throughout the procedure.  Additionally, 8 mg Zofran and 3.375 g Zosyn were administered intravenously.  Sedation Time: 195 minutes  FLUOROSCOPY TIME:  45 minutes 54 seconds  CONTRAST:  270 cc Omnipaque 300  TECHNIQUE: Informed consent was obtained from the patient following explanation of the procedure, risks, benefits and alternatives. The patient understands, agrees and consents for the procedure. All questions were addressed. A time out was performed.  Maximal barrier sterile technique utilized including caps, mask, sterile gowns, sterile gloves, large sterile drape, hand hygiene, and Betadine skin prep.  The left upper quadrant was interrogated with ultrasound. A branch of the segment 3 portal vein was identified. Local anesthesia was attained by infiltration with 1% lidocaine. A small dermatotomy was made. Under real-time sonographic guidance, a 21 gauge Chiba needle was used to puncture the peripheral radicle of the segment 3 portal vein. A 0.018 wire was advanced into the portal venous system. The inner dilator from a [place] he prima axis set was advanced over the wire. A limited portal venogram was performed confirming portal venous access. The full a prima access sheath was then advanced over the wire into the left portal vein. Through this, a short Amplatz wire was advanced in the portal vein in the [place] a prima access sheath exchanged for a working 5 [place] vascular sheath. An angled catheter and Glidewire were subsequently used to navigate into the main portal vein. A pigtail catheter was advanced in the main portal vein. The portal venous pressure was then  measured. The mean a portal venous pressure is 10 mm of mercury which is top-normal but not clinically  significant.  Portal venography was then performed in multiple obliquities to outline the patient's anatomy. There is a small accessory portal vein which appears to supply a portion of segment 7. Otherwise, the portal venous anatomy appears fairly classic. The pigtail catheter was exchanged for an angled Kumpe catheter. The catheter was used to select the accessory segment 7 of portal vein. Venography was performed confirming location. The vessel was then coil embolized in stepwise fashion using first 100- 300 micron Embospheres followed by 300 - 500 and finally 500 - 700 micron Embospheres. Once near stasis was attained, the vessel origin was coil embolized with a single 6 x 10 mm interlock 0.035 coil.  The Kumpe catheter was next used to select the posterior division of the right hepatic vein. Venography was performed confirming vessel location. There are 2 dominant branch vessels. The micro catheter was positioned at the common trunk of the posterior division and the common trunk was embolized ni stepwise fashion with particles as described above. Near stasis was attained. The 5 French catheter was used to select each of the branch vessels and these were coil embolized using a combination of 6 and 8 mm interlock coils. Two coils were used on the more inferior branch and a single coil used on the more superior branch. The 5 French catheter was then navigated into the common trunk of the anterior division which divides into the segment 8 and segment 5 branches. The catheter was advanced into the segment 8 branch. Stepwise particle embolization was performed to near stasis followed by coil embolization using a 10 x 40 cm interlock coil. Unfortunately, the coil pack partially occluded the segment 5 branch origin.  However using careful probing with a glidewire, the Kumpe the catheter was ultimately navigated through the coil pack and into the segment 5 portal vein. Venography confirmed vessel location and anatomy.  This vessel was also particle embolized thin stepwise fashion to near stasis. The vessel was then coil embolized using a 10 x 20 mm interlock coil followed by two 15 x 40 mm interlock coils which were allowed to extend into the common trunk.  A post embolization portogram was performed. There is no flow into any of the right-sided hepatic veins. Reflux of contrast material can be seen flowing into a large vessel which joins the inferior aspect of the right main portal vein. This appears to be the primary venous drainage of the large right pad 0 cellular carcinoma consistent with the large volume of arterial portal shunting demonstrated on the prior CT scan. A copy catheter was drawn back into the left portal vein and portal venography was performed. The segment 4 branch was identified. The micro catheter was used to advanced into the segment 4 branch which was then particle embolized stepwise fashion. Finally, this vessel was coil embolized with a for by 10 interlock coil.  The Kumpe the catheter was removed. The sheath was drawn back while gently puffing contrast until the sheath was within the hepatic tract outside of the portal vein. A copy catheter was reintroduced to the tip of the sheath. As the sheath was pulled back, several Gel-Foam core P does were injected through the 5 French catheter into the transhepatic tract to provide hemostasis. The catheter and sheath were removed. Pressure was held for approximately 5 min. The patient tolerated the procedure very well.  COMPLICATIONS: None immediate.  IMPRESSION:  1. Transhepatic portal venous pressure measurements demonstrated portal venous pressure of 10 mm of mercury which is top-normal but not clinically significant. 2. Successful extended right portal venous embolization with embolization of all branches of the right portal venous system in addition to the segment 4 branch of the left portal venous system. Patient will undergo follow-up CT scan in 4 weeks  followed by planned extended right hepatectomy by Dr. Barry Dienes.  Signed,  Criselda Peaches, MD  [facility]  [facility]   Electronically Signed   By: Jacqulynn Cadet M.D.   On: 01/29/2014 17:39   Ir Transcath/emboliz  01/29/2014   CLINICAL DATA:  73 year old female with a dominant right sided hepatocellular carcinoma and several small satellite nodules in the medial segment of the left hepatic lobe. She has noted no new history of cirrhosis and presents for extended right portal vein embolization prior to planned surgical resection.  EXAM: TRANSCATHETER THERAPY EMBOLIZATION; ADDITIONAL ARTERIOGRAPHY; PORTAL VENOGRAPHY; IR ULTRASOUND GUIDANCE VASC ACCESS LEFT  Date: 01/29/2014  PROCEDURE: 1. Ultrasound-guided transhepatic portal venous access 2. Percutaneous transhepatic portal venogram with pressure measurements 3. Catheterization of a small accessory segment 7 portal vein with portal venogram 4. Combined coil and particle embolization of the segment 7 accessory portal vein 5. Catheterization of the posterior division of the right portal vein with portal venogram 6. Particle embolization of the posterior division of the right portal vein 7. Catheterization of the segment 7 portal vein with venogram 8. Coil embolization of the segment 7 vein 9. Catheterization of the segment 6 portal vein with venogram 10. Coil embolization of the segment 7 vein 11. Catheterization of the segment 8 portal vein with a venogram 12. Combined coil and particle embolization of the segment 8 portal vein 13. Catheterization of the segment 5 portal vein with venogram 14. Combined coil and particle embolization of the segment 5 portal vein 15. Catheterization of the segment for portal vein with venogram 16. Combined coil and particle embolization of the segment for portal vein 17. Gel-Foam embolization of the transhepatic tract Interventional Radiologist:  Criselda Peaches, MD   ANESTHESIA/SEDATION: Moderate (conscious) sedation was used. Six mg Versed, 300 mcg Fentanyl were administered intravenously. The patient's vital signs were monitored continuously by radiology nursing throughout the procedure.  Additionally, 8 mg Zofran and 3.375 g Zosyn were administered intravenously.  Sedation Time: 195 minutes  FLUOROSCOPY TIME:  45 minutes 54 seconds  CONTRAST:  270 cc Omnipaque 300  TECHNIQUE: Informed consent was obtained from the patient following explanation of the procedure, risks, benefits and alternatives. The patient understands, agrees and consents for the procedure. All questions were addressed. A time out was performed.  Maximal barrier sterile technique utilized including caps, mask, sterile gowns, sterile gloves, large sterile drape, hand hygiene, and Betadine skin prep.  The left upper quadrant was interrogated with ultrasound. A branch of the segment 3 portal vein was identified. Local anesthesia was attained by infiltration with 1% lidocaine. A small dermatotomy was made. Under real-time sonographic guidance, a 21 gauge Chiba needle was used to puncture the peripheral radicle of the segment 3 portal vein. A 0.018 wire was advanced into the portal venous system. The inner dilator from a [place] he prima axis set was advanced over the wire. A limited portal venogram was performed confirming portal venous access. The full a prima access sheath was then advanced over the wire into the left portal vein. Through this, a short Amplatz wire was advanced in the portal  vein in the [place] a prima access sheath exchanged for a working 5 [place] vascular sheath. An angled catheter and Glidewire were subsequently used to navigate into the main portal vein. A pigtail catheter was advanced in the main portal vein. The portal venous pressure was then measured. The mean a portal venous pressure is 10 mm of mercury which is top-normal but not clinically significant.  Portal venography was then performed  in multiple obliquities to outline the patient's anatomy. There is a small accessory portal vein which appears to supply a portion of segment 7. Otherwise, the portal venous anatomy appears fairly classic. The pigtail catheter was exchanged for an angled Kumpe catheter. The catheter was used to select the accessory segment 7 of portal vein. Venography was performed confirming location. The vessel was then coil embolized in stepwise fashion using first 100- 300 micron Embospheres followed by 300 - 500 and finally 500 - 700 micron Embospheres. Once near stasis was attained, the vessel origin was coil embolized with a single 6 x 10 mm interlock 0.035 coil.  The Kumpe catheter was next used to select the posterior division of the right hepatic vein. Venography was performed confirming vessel location. There are 2 dominant branch vessels. The micro catheter was positioned at the common trunk of the posterior division and the common trunk was embolized ni stepwise fashion with particles as described above. Near stasis was attained. The 5 French catheter was used to select each of the branch vessels and these were coil embolized using a combination of 6 and 8 mm interlock coils. Two coils were used on the more inferior branch and a single coil used on the more superior branch. The 5 French catheter was then navigated into the common trunk of the anterior division which divides into the segment 8 and segment 5 branches. The catheter was advanced into the segment 8 branch. Stepwise particle embolization was performed to near stasis followed by coil embolization using a 10 x 40 cm interlock coil. Unfortunately, the coil pack partially occluded the segment 5 branch origin.  However using careful probing with a glidewire, the Kumpe the catheter was ultimately navigated through the coil pack and into the segment 5 portal vein. Venography confirmed vessel location and anatomy. This vessel was also particle embolized thin stepwise  fashion to near stasis. The vessel was then coil embolized using a 10 x 20 mm interlock coil followed by two 15 x 40 mm interlock coils which were allowed to extend into the common trunk.  A post embolization portogram was performed. There is no flow into any of the right-sided hepatic veins. Reflux of contrast material can be seen flowing into a large vessel which joins the inferior aspect of the right main portal vein. This appears to be the primary venous drainage of the large right pad 0 cellular carcinoma consistent with the large volume of arterial portal shunting demonstrated on the prior CT scan. A copy catheter was drawn back into the left portal vein and portal venography was performed. The segment 4 branch was identified. The micro catheter was used to advanced into the segment 4 branch which was then particle embolized stepwise fashion. Finally, this vessel was coil embolized with a for by 10 interlock coil.  The Kumpe the catheter was removed. The sheath was drawn back while gently puffing contrast until the sheath was within the hepatic tract outside of the portal vein. A copy catheter was reintroduced to the tip of the sheath. As the sheath was  pulled back, several Gel-Foam core P does were injected through the 5 French catheter into the transhepatic tract to provide hemostasis. The catheter and sheath were removed. Pressure was held for approximately 5 min. The patient tolerated the procedure very well.  COMPLICATIONS: None immediate.  IMPRESSION: 1. Transhepatic portal venous pressure measurements demonstrated portal venous pressure of 10 mm of mercury which is top-normal but not clinically significant. 2. Successful extended right portal venous embolization with embolization of all branches of the right portal venous system in addition to the segment 4 branch of the left portal venous system. Patient will undergo follow-up CT scan in 4 weeks followed by planned extended right hepatectomy by Dr.  Barry Dienes.  Signed,  Criselda Peaches, MD  [facility]  [facility]   Electronically Signed   By: Jacqulynn Cadet M.D.   On: 01/29/2014 17:39   Nm Pet Image Initial (pi) Whole Body  01/15/2014   CLINICAL DATA:  Initial treatment strategy for liver mass. History of breast cancer and melanoma.  EXAM: NUCLEAR MEDICINE PET WHOLE BODY  TECHNIQUE: 9.4 mCi F-18 FDG was injected intravenously. Full-ring PET imaging was performed from the vertex to the feet after the radiotracer. CT data was obtained and used for attenuation correction and anatomic localization.  FASTING BLOOD GLUCOSE:  Value: 94mg /dl  COMPARISON:  CT abdomen 6/16 /2015  FINDINGS: Head/Neck: No hypermetabolic lymph nodes in the neck.  Chest: No hypermetabolic mediastinal or hilar nodes. No suspicious pulmonary nodules on the CT scan.  Abdomen/Pelvis: There is a large hypermetabolic mass occupying expanding the right hepatic lobe of the liver. This mass measures up to 13 x 14 cm axial dimension and a 22 cm in craniocaudad dimension. This mass is intensely hypermetabolic with SUV max equal 16.8. There is a separate lesion in the more superior right hepatic lobe.  No hypermetabolic periportal lymph nodes. No hypermetabolic retroperitoneal lymph nodes.  No additional abnormal metabolic activity within the abdomen pelvis other than benign metabolic activity associated with sigmoid diverticulosis.  Skeleton: No focal hypermetabolic activity to suggest skeletal metastasis.  Extremities: No hypermetabolic activity to suggest metastasis. No cutaneous lesion identified.  IMPRESSION: 1. Intense hypermetabolic mass within the liver consistent with hepatic metastasis. 2. No additional abnormal metabolic activity on the whole-body scan to suggest metastasis or recurrence.   Electronically Signed   By: Suzy Bouchard M.D.   On: 01/15/2014 16:07   US Biopsy  01/22/2014   CLINICAL DATA:  73 year old with a large right  hepatic lesion. History of breast cancer and melanoma. Tissue diagnosis is needed.  EXAM: ULTRASOUND BIOPSY OF THE HEPATIC LESION  Physician: Stephan Minister. Anselm Pancoast, MD  FLUOROSCOPY TIME:  None  MEDICATIONS: 2 mg Versed and 100 mcg fentanyl. A radiology nurse monitored the patient for moderate sedation.  ANESTHESIA/SEDATION: Moderate sedation time: 24 min  PROCEDURE: The procedure was explained to the patient. The risks and benefits of the procedure were discussed and the patient's questions were addressed. Informed consent was obtained from the patient. Liver was evaluated with ultrasound. The right side of the abdomen was prepped and draped in a sterile fashion. The skin and liver capsule were anesthetized with 1% lidocaine. Using ultrasound guidance, a 17 gauge needle was directed into the right hepatic lobe and the hepatic lesion. Three core biopsies were obtained with an 18 gauge core device. Specimens were placed in formalin. 17 gauge needle was removed without complication. Bandage placed over the puncture site.  FINDINGS: Large heterogeneous lesion occupying  most of the right hepatic lobe. The upper portion of the lesion was biopsied in order to traverse normal liver parenchyma. Needle position was confirmed within the lesion.  COMPLICATIONS: None  IMPRESSION: Ultrasound-guided core biopsies of the right hepatic lesion.   Electronically Signed   By: Markus Daft M.D.   On: 01/22/2014 15:14   Ir US Guide Vasc Access Left  01/29/2014   CLINICAL DATA:  73 year old female with a dominant right sided hepatocellular carcinoma and several small satellite nodules in the medial segment of the left hepatic lobe. She has noted no new history of cirrhosis and presents for extended right portal vein embolization prior to planned surgical resection.  EXAM: TRANSCATHETER THERAPY EMBOLIZATION; ADDITIONAL ARTERIOGRAPHY; PORTAL VENOGRAPHY; IR ULTRASOUND GUIDANCE VASC ACCESS LEFT  Date: 01/29/2014  PROCEDURE: 1. Ultrasound-guided  transhepatic portal venous access 2. Percutaneous transhepatic portal venogram with pressure measurements 3. Catheterization of a small accessory segment 7 portal vein with portal venogram 4. Combined coil and particle embolization of the segment 7 accessory portal vein 5. Catheterization of the posterior division of the right portal vein with portal venogram 6. Particle embolization of the posterior division of the right portal vein 7. Catheterization of the segment 7 portal vein with venogram 8. Coil embolization of the segment 7 vein 9. Catheterization of the segment 6 portal vein with venogram 10. Coil embolization of the segment 7 vein 11. Catheterization of the segment 8 portal vein with a venogram 12. Combined coil and particle embolization of the segment 8 portal vein 13. Catheterization of the segment 5 portal vein with venogram 14. Combined coil and particle embolization of the segment 5 portal vein 15. Catheterization of the segment for portal vein with venogram 16. Combined coil and particle embolization of the segment for portal vein 17. Gel-Foam embolization of the transhepatic tract Interventional Radiologist:  Criselda Peaches, MD  ANESTHESIA/SEDATION: Moderate (conscious) sedation was used. Six mg Versed, 300 mcg Fentanyl were administered intravenously. The patient's vital signs were monitored continuously by radiology nursing throughout the procedure.  Additionally, 8 mg Zofran and 3.375 g Zosyn were administered intravenously.  Sedation Time: 195 minutes  FLUOROSCOPY TIME:  45 minutes 54 seconds  CONTRAST:  270 cc Omnipaque 300  TECHNIQUE: Informed consent was obtained from the patient following explanation of the procedure, risks, benefits and alternatives. The patient understands, agrees and consents for the procedure. All questions were addressed. A time out was performed.  Maximal barrier sterile technique utilized including caps, mask, sterile gowns, sterile gloves, large sterile drape,  hand hygiene, and Betadine skin prep.  The left upper quadrant was interrogated with ultrasound. A branch of the segment 3 portal vein was identified. Local anesthesia was attained by infiltration with 1% lidocaine. A small dermatotomy was made. Under real-time sonographic guidance, a 21 gauge Chiba needle was used to puncture the peripheral radicle of the segment 3 portal vein. A 0.018 wire was advanced into the portal venous system. The inner dilator from a [place] he prima axis set was advanced over the wire. A limited portal venogram was performed confirming portal venous access. The full a prima access sheath was then advanced over the wire into the left portal vein. Through this, a short Amplatz wire was advanced in the portal vein in the [place] a prima access sheath exchanged for a working 5 [place] vascular sheath. An angled catheter and Glidewire were subsequently used to navigate into the main portal vein. A pigtail catheter was advanced in the main portal vein. The  portal venous pressure was then measured. The mean a portal venous pressure is 10 mm of mercury which is top-normal but not clinically significant.  Portal venography was then performed in multiple obliquities to outline the patient's anatomy. There is a small accessory portal vein which appears to supply a portion of segment 7. Otherwise, the portal venous anatomy appears fairly classic. The pigtail catheter was exchanged for an angled Kumpe catheter. The catheter was used to select the accessory segment 7 of portal vein. Venography was performed confirming location. The vessel was then coil embolized in stepwise fashion using first 100- 300 micron Embospheres followed by 300 - 500 and finally 500 - 700 micron Embospheres. Once near stasis was attained, the vessel origin was coil embolized with a single 6 x 10 mm interlock 0.035 coil.  The Kumpe catheter was next used to select the posterior division of the right hepatic vein. Venography was  performed confirming vessel location. There are 2 dominant branch vessels. The micro catheter was positioned at the common trunk of the posterior division and the common trunk was embolized ni stepwise fashion with particles as described above. Near stasis was attained. The 5 French catheter was used to select each of the branch vessels and these were coil embolized using a combination of 6 and 8 mm interlock coils. Two coils were used on the more inferior branch and a single coil used on the more superior branch. The 5 French catheter was then navigated into the common trunk of the anterior division which divides into the segment 8 and segment 5 branches. The catheter was advanced into the segment 8 branch. Stepwise particle embolization was performed to near stasis followed by coil embolization using a 10 x 40 cm interlock coil. Unfortunately, the coil pack partially occluded the segment 5 branch origin.  However using careful probing with a glidewire, the Kumpe the catheter was ultimately navigated through the coil pack and into the segment 5 portal vein. Venography confirmed vessel location and anatomy. This vessel was also particle embolized thin stepwise fashion to near stasis. The vessel was then coil embolized using a 10 x 20 mm interlock coil followed by two 15 x 40 mm interlock coils which were allowed to extend into the common trunk.  A post embolization portogram was performed. There is no flow into any of the right-sided hepatic veins. Reflux of contrast material can be seen flowing into a large vessel which joins the inferior aspect of the right main portal vein. This appears to be the primary venous drainage of the large right pad 0 cellular carcinoma consistent with the large volume of arterial portal shunting demonstrated on the prior CT scan. A copy catheter was drawn back into the left portal vein and portal venography was performed. The segment 4 branch was identified. The micro catheter was used  to advanced into the segment 4 branch which was then particle embolized stepwise fashion. Finally, this vessel was coil embolized with a for by 10 interlock coil.  The Kumpe the catheter was removed. The sheath was drawn back while gently puffing contrast until the sheath was within the hepatic tract outside of the portal vein. A copy catheter was reintroduced to the tip of the sheath. As the sheath was pulled back, several Gel-Foam core P does were injected through the 5 French catheter into the transhepatic tract to provide hemostasis. The catheter and sheath were removed. Pressure was held for approximately 5 min. The patient tolerated the procedure very well.  COMPLICATIONS: None immediate.  IMPRESSION: 1. Transhepatic portal venous pressure measurements demonstrated portal venous pressure of 10 mm of mercury which is top-normal but not clinically significant. 2. Successful extended right portal venous embolization with embolization of all branches of the right portal venous system in addition to the segment 4 branch of the left portal venous system. Patient will undergo follow-up CT scan in 4 weeks followed by planned extended right hepatectomy by Dr. Barry Dienes.  Signed,  Criselda Peaches, MD  [facility]  [facility] Radiology   Electronically Signed   By: Jacqulynn Cadet M.D.   On: 01/29/2014 17:39   Ir Radiologist Eval & Mgmt  01/29/2014   : January 28, 2014  Stark Klein, MD  [phone] [address] [place]  [place], [place]  [address]  Re:  Shelby Hood (DOB:  1941-04-15)  Dear Dr. Barry Dienes,  I had the pleasure of seeing Shelby Hood in clinic today for evaluation of her newly diagnosed hepatocellular carcinoma. I know you are very familiar with her past medical history, please allow me to recap for our records.  Shelby Hood is a 73 year old female with a past history of breast cancer in 2010. Several months ago, beginning in May of 2015, she began to notice progressive  weakness, weight loss (approximately 10 pounds) and a palpable lump in her right upper quadrant. Workup including a CT scan of the abdomen and pelvis demonstrated a large hypervascular and centrally necrotic liver mass taking up the majority of the right hepatic lobe. Ultrasound-guided biopsy confirmed the diagnosis of hepatocellular carcinoma. Her liver does not appear overtly cirrhotic by imaging. She has no history of hepatitis, intravenous drug use, or heavy alcohol use. Currently, her appetite is poor, she continues to have weight loss and a sensation of upper abdominal and right upper quadrant fullness. She denies abdominal pain, fever, chills. She does feel nauseated occasionally after eating. No change in bowel habits, hematemesis, hematochezia or melanotic stools.  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  Past Surgical History: Ectopic pregnancy surgery, tubal ligation, breast lumpectomy, melanoma excision and skin biopsy.  Current Medications: Amlodipine 2.5 mg daily, gemfibrozil, 600 mg daily, ibuprofen 200 mg every 6 hours as needed, Synthroid 100 mcg daily, Claritin 10 mg daily as needed, losartan 100 mg daily.  Allergies:  Codeine and tetanus toxoids.  Family History: Significant for history of breast cancer in her mother.  Social History: She is a retired Network engineer at [employer]. Her adult daughter is with her at the clinical appointment. She is widowed and currently lives alone. Her daughter offers good social support and can stay with her following procedures. She is a former smoker but quit years ago in 1968. She drinks a single glass of wine on most days. She has no history of recreational or intravenous drug use. She is not currently sexually active.  Review of Systems: Pertinent positives listed in the HPI. Otherwise, the 12-point review of systems was negative.  Physical Examination:  Vital signs:  Temperature: 98.2 degrees, Heart Rate: 81, Blood Pressure: 136/71, Respiratory Rate: 14, Oxygen Saturation: 96% on room air.  General: Well-nourished, well-developed female who appears anxious but is in no acute distress. She appears her stated age.  Cardiac:  Regular rate and rhythm.  Lungs:  Clear to auscultation bilaterally.  Skin:  No rash or purpura.  HEENT:  Sclera are clear and anicteric.  Abdomen: Palpable liver edge approximately  10 cm below the diaphragm. Mild tenderness to palpitation. Soft and nondistended.  Neuro:  Alert and oriented x4.  No focal deficits.  Psych:  Appropriate affect.  Imaging: CT scan of the abdomen and pelvis demonstrates a 22-cm, centrally necrotic, right hepatic mass completely encompassing hepatic segments 6 and 5. There is significant intratumoral portal venous or arterial venous shunting. A few small satellite lesions are identified in hepatic segment 4A and 4B. The lateral segment of the left hepatic lobe appears disease free. The main portal veins are patent. Subsequent PET imaging on 01/15/2014 confirmed hypermetabolic activity throughout the primary and satellite tumors. No distant metastatic disease or extrahepatic metastatic disease is identified.  Laboratory Evaluation: Hepatic function is currently good. INR is 1.0, platelets are 537, AFP is mildly elevated at 26. Total bilirubin is 0.8, albumin 3.5, creatinine is normal 0.8.  Assessment and Plan:  Unfortunate 73 year old female with very large and centrally necrotic right liver hepatocellular carcinoma. Her liver does not appear overtly cirrhotic by morphology and her hepatic synthetic function is essentially normal. This could represent a spontaneous HCC in the setting of normal liver which is uncommon but has been reported. Alternately, she may have low-grade underlying cirrhosis secondary to NASH, or potentially chronic hepatic B although she has no risk factors for this illness. I would consider hepatitis serologies  just for further evaluation.  Her best option for cure is extended right hepatectomy including the medial segments of the left hepatic lobe. Currently, her functional liver remnant including segments 2 and 3 would be borderline sufficient. Preoperative embolization of the right and medial segment left portal veins would offer several benefits including hypertrophy of the functional liver remnant, decreased tumor vascularity and slightly decreased size of the residual normal right hepatic parenchyma.  Follow-up imaging at 4 weeks would be recommended to evaluate the functional liver remnant followed quickly by surgery. There is some risk of progressive growth of the tumor secondary to the release of hepatotrophic factors from the portal vein embolization procedure.  During the procedure, once access if gained to the portal venous system, a direct portal pressure will be measured. If the patient has significant portal hypertension, that would constitute a contraindication to portal venous embolization. I explained this, the procedure, the risks and complications to both the patient and her daughter who are in agreement. They understand that while this procedure is typically safe, the main complications including bleeding from the hepatic access site and inadvertent embolization or thrombosis of the left portal venous system resulting in decreased future treatment options, and potentially decreased hepatic function/hepatic failure.  They understand and wish to proceed.  Thank you for sending this delightful woman to see me in consultation. I greatly enjoyed meeting her and look forward to participating in her care.  Signed,  Criselda Peaches, MD  [facility]  [facility] Radiology   Electronically Signed   By: Jacqulynn Cadet M.D.   On: 01/29/2014 09:48    Discharge Exam: Blood pressure 139/65, pulse 77, temperature 100.1 F (37.8 C), temperature source Oral, resp. rate  16, height 5\' 8"  (1.727 m), weight 162 lb (73.483 kg), SpO2 93.00%.Temp at discharge 98.9 degrees.  The patient is awake, alert and oriented. Chest is clear to auscultation bilaterally. Heart with regular rate and rhythm. Abdomen soft, positive bowel sounds, puncture site epigastric region clean and dry, no significant hematoma , minimal tenderness. Hepatomegaly noted. Extremities with full range of motion and no significant edema. Disposition: Home  Discharge  Instructions   Call MD for:  difficulty breathing, headache or visual disturbances    Complete by:  As directed      Call MD for:  extreme fatigue    Complete by:  As directed      Call MD for:  hives    Complete by:  As directed      Call MD for:  persistant dizziness or light-headedness    Complete by:  As directed      Call MD for:  persistant nausea and vomiting    Complete by:  As directed      Call MD for:  redness, tenderness, or signs of infection (pain, swelling, redness, odor or green/yellow discharge around incision site)    Complete by:  As directed      Call MD for:  severe uncontrolled pain    Complete by:  As directed      Call MD for:  temperature >100.4    Complete by:  As directed      Change dressing (specify)    Complete by:  As directed   May apply new bandaid to puncture site left abdomen daily for next 2-3 days; may clean site with soap and water     Diet - low sodium heart healthy    Complete by:  As directed      Discharge instructions    Complete by:  As directed   Follow up with Dr. Barry Dienes as scheduled; contact radiology at [phone] or [phone] with any questions related to recent liver  procedure     Discharge patient    Complete by:  As directed      Driving Restrictions    Complete by:  As directed   No driving for next 24 hours     Increase activity slowly    Complete by:  As directed      Lifting restrictions    Complete by:  As directed   Avoid heavy lifting     May shower / Bathe     Complete by:  As directed      May walk up steps    Complete by:  As directed             Medication List         ibuprofen 200 MG tablet  Commonly known as:  ADVIL,MOTRIN  Take 600-800 mg by mouth every 6 (six) hours as needed for moderate pain.     levothyroxine 100 MCG tablet  Commonly known as:  SYNTHROID, LEVOTHROID  Take 100 mcg by mouth daily before breakfast.     losartan 100 MG tablet  Commonly known as:  COZAAR  Take 100 mg by mouth daily.           Follow-up Information   Follow up with [facility], MD. (follow up with Dr. Barry Dienes as scheduled; call [phone] with any questions)    Specialty:  General Surgery   Contact information:   [address] [place] [address] [phone]       Signed: Autumn Messing 01/30/2014, 11:08 AM

## 2014-01-30 NOTE — Progress Notes (Signed)
Nursing Discharge Summary  Patient ID: SHARONE PICCHI MRN: [id number] DOB/AGE: [birth year] 73 y.o.  Admit date: 01/29/2014 Discharge date: 01/30/2014  Discharged Condition: good  Disposition: Home  Follow-up Information   Follow up with [facility], MD. (follow up with Dr. Barry Dienes as scheduled; call [phone] with any questions)    Specialty:  General Surgery   Contact information:   [place] [address] [phone]       Prescriptions Given: Prescriptions given to patient by Rowe Robert, PA  Means of Discharge: Patient to be taken home via private vehicle with daughter. Stable from AM assessment  Signed: Buel Ream 01/30/2014, 11:21 AM

## 2014-02-05 ENCOUNTER — Other Ambulatory Visit (INDEPENDENT_AMBULATORY_CARE_PROVIDER_SITE_OTHER): Payer: Self-pay | Admitting: General Surgery

## 2014-02-05 DIAGNOSIS — C228 Malignant neoplasm of liver, primary, unspecified as to type: Secondary | ICD-10-CM

## 2014-02-12 ENCOUNTER — Other Ambulatory Visit: Payer: Self-pay | Admitting: Oncology

## 2014-02-12 DIAGNOSIS — Z853 Personal history of malignant neoplasm of breast: Secondary | ICD-10-CM

## 2014-02-14 ENCOUNTER — Telehealth (INDEPENDENT_AMBULATORY_CARE_PROVIDER_SITE_OTHER): Payer: Self-pay

## 2014-02-14 ENCOUNTER — Encounter (INDEPENDENT_AMBULATORY_CARE_PROVIDER_SITE_OTHER): Payer: Self-pay | Admitting: General Surgery

## 2014-02-14 ENCOUNTER — Ambulatory Visit (INDEPENDENT_AMBULATORY_CARE_PROVIDER_SITE_OTHER): Payer: Medicare Other | Admitting: General Surgery

## 2014-02-14 VITALS — BP 142/68 | HR 70 | Resp 16 | Ht 68.0 in | Wt 160.0 lb

## 2014-02-14 DIAGNOSIS — C228 Malignant neoplasm of liver, primary, unspecified as to type: Secondary | ICD-10-CM

## 2014-02-14 NOTE — Telephone Encounter (Signed)
Pt's CT scan rescheduled to 02/25/14 at [facility] (301).  Letter to [facility] faxed to [phone].  Copy mailed to the patient.  Pt aware.

## 2014-02-14 NOTE — Progress Notes (Signed)
HISTORY: Pt is a 73 yo F with large right HCC.  She underwent portal vein embolization.  She is doing better in terms of PO intake and nausea.  She is taking the ativan at night and it is helping her sleep.      PERTINENT REVIEW OF SYSTEMS: Otherwise negative x 11.    Filed Vitals:   02/14/14 0948  BP: 142/68  Pulse: 70  Resp: 16   Wt Readings from Last 3 Encounters:  02/14/14 160 lb (72.576 kg)  01/29/14 162 lb (73.483 kg)  01/27/14 163 lb (73.936 kg)    EXAM: Head: Normocephalic and atraumatic.  Eyes:  Conjunctivae are normal. Pupils are equal, round, and reactive to light. No scleral icterus.  Neck:  Normal range of motion. Neck supple. No tracheal deviation present. No thyromegaly present.  Resp: No respiratory distress, normal effort. Abd:  Abdomen is soft, non distended and non tender. No masses are palpable.  There is no rebound and no guarding. I can still feel mass on right.   Neurological: Alert and oriented to person, place, and time. Coordination normal.  Skin: Skin is warm and dry. No rash noted. No diaphoretic. No erythema. No pallor.  Psychiatric: Normal mood and affect. Normal behavior. Judgment and thought content normal.      ASSESSMENT AND PLAN:   Malignant neoplasm of liver, primary Patient's questions were answered.   We rediscussed risks/benefits.  We reviewed post op care.    40 min spent in counseling and 5 min in examination.        Milus Height, MD Surgical Oncology, [facility]  Gennette Pac, MD No ref. provider found

## 2014-02-14 NOTE — Assessment & Plan Note (Addendum)
Patient's questions were answered.   We rediscussed risks/benefits.  We reviewed post op care.    40 min spent in counseling and 5 min in examination.

## 2014-02-14 NOTE — Patient Instructions (Signed)
We will move the CT out a week.  We will see you August 11!  :)

## 2014-02-18 ENCOUNTER — Encounter (INDEPENDENT_AMBULATORY_CARE_PROVIDER_SITE_OTHER): Payer: Self-pay

## 2014-02-19 ENCOUNTER — Other Ambulatory Visit: Payer: Medicare Other

## 2014-02-20 ENCOUNTER — Telehealth (INDEPENDENT_AMBULATORY_CARE_PROVIDER_SITE_OTHER): Payer: Self-pay

## 2014-02-20 NOTE — Telephone Encounter (Signed)
Pt is scheduled for laparoscopic diagnostic surgery with Dr. Barry Dienes for liver

## 2014-02-20 NOTE — Telephone Encounter (Signed)
Cancer.  She is c/o a sharp pain in her right side down close to her hip.  Pain is constant and sometimes wakes her up at night.  She denies any injury or over exertion.  She is very worried.  I told her I would make Dr. Barry Dienes aware and we would call her back with any instructions.  Pt was appreciative.

## 2014-02-21 ENCOUNTER — Encounter (HOSPITAL_COMMUNITY): Payer: Self-pay | Admitting: Pharmacy Technician

## 2014-02-24 ENCOUNTER — Encounter (HOSPITAL_COMMUNITY)
Admission: RE | Admit: 2014-02-24 | Discharge: 2014-02-24 | Disposition: A | Discharge status: Home / Self Care | Payer: Medicare Other | Source: Ambulatory Visit | Attending: General Surgery | Admitting: General Surgery

## 2014-02-24 ENCOUNTER — Ambulatory Visit (HOSPITAL_COMMUNITY)
Admission: RE | Admit: 2014-02-24 | Discharge: 2014-02-24 | Disposition: A | Discharge status: Home / Self Care | Payer: Medicare Other | Source: Ambulatory Visit | Attending: Anesthesiology | Admitting: Anesthesiology

## 2014-02-24 ENCOUNTER — Other Ambulatory Visit (INDEPENDENT_AMBULATORY_CARE_PROVIDER_SITE_OTHER): Payer: Self-pay | Admitting: *Deleted

## 2014-02-24 ENCOUNTER — Encounter (HOSPITAL_COMMUNITY): Payer: Self-pay

## 2014-02-24 DIAGNOSIS — K769 Liver disease, unspecified: Secondary | ICD-10-CM | POA: Diagnosis not present

## 2014-02-24 DIAGNOSIS — Z01818 Encounter for other preprocedural examination: Secondary | ICD-10-CM | POA: Insufficient documentation

## 2014-02-24 DIAGNOSIS — R160 Hepatomegaly, not elsewhere classified: Secondary | ICD-10-CM

## 2014-02-24 HISTORY — DX: Hypothyroidism, unspecified: E03.9

## 2014-02-24 LAB — CBC WITH DIFFERENTIAL/PLATELET
BASOS ABS: 0 10*3/uL (ref 0.0–0.1)
Basophils Relative: 0 % (ref 0–1)
EOS ABS: 0.3 10*3/uL (ref 0.0–0.7)
EOS PCT: 3 % (ref 0–5)
HCT: 36.7 % (ref 36.0–46.0)
Hemoglobin: 11.7 g/dL — ABNORMAL LOW (ref 12.0–15.0)
Lymphocytes Relative: 12 % (ref 12–46)
Lymphs Abs: 1.3 10*3/uL (ref 0.7–4.0)
MCH: 28.7 pg (ref 26.0–34.0)
MCHC: 31.9 g/dL (ref 30.0–36.0)
MCV: 90.2 fL (ref 78.0–100.0)
Monocytes Absolute: 0.7 10*3/uL (ref 0.1–1.0)
Monocytes Relative: 6 % (ref 3–12)
Neutro Abs: 8.4 10*3/uL — ABNORMAL HIGH (ref 1.7–7.7)
Neutrophils Relative %: 79 % — ABNORMAL HIGH (ref 43–77)
Platelets: 443 10*3/uL — ABNORMAL HIGH (ref 150–400)
RBC: 4.07 MIL/uL (ref 3.87–5.11)
RDW: 15.3 % (ref 11.5–15.5)
WBC: 10.7 10*3/uL — ABNORMAL HIGH (ref 4.0–10.5)

## 2014-02-24 LAB — COMPREHENSIVE METABOLIC PANEL
ALT: 51 U/L — AB (ref 0–35)
AST: 89 U/L — ABNORMAL HIGH (ref 0–37)
Albumin: 3.4 g/dL — ABNORMAL LOW (ref 3.5–5.2)
Alkaline Phosphatase: 159 U/L — ABNORMAL HIGH (ref 39–117)
Anion gap: 14 (ref 5–15)
BUN: 15 mg/dL (ref 6–23)
CALCIUM: 9.3 mg/dL (ref 8.4–10.5)
CO2: 24 mEq/L (ref 19–32)
CREATININE: 0.82 mg/dL (ref 0.50–1.10)
Chloride: 100 mEq/L (ref 96–112)
GFR calc Af Amer: 81 mL/min — ABNORMAL LOW (ref >90)
GFR calc non Af Amer: 70 mL/min — ABNORMAL LOW (ref >90)
GLUCOSE: 100 mg/dL — AB (ref 70–99)
Potassium: 4.9 mEq/L (ref 3.7–5.3)
SODIUM: 138 meq/L (ref 137–147)
TOTAL PROTEIN: 6.8 g/dL (ref 6.0–8.3)
Total Bilirubin: 0.7 mg/dL (ref 0.3–1.2)

## 2014-02-24 LAB — URINE MICROSCOPIC-ADD ON

## 2014-02-24 LAB — PROTIME-INR
INR: 1.01 (ref 0.00–1.49)
Prothrombin Time: 13.3 seconds (ref 11.6–15.2)

## 2014-02-24 LAB — URINALYSIS, ROUTINE W REFLEX MICROSCOPIC
Bilirubin Urine: NEGATIVE
Glucose, UA: NEGATIVE mg/dL
Hgb urine dipstick: NEGATIVE
Ketones, ur: NEGATIVE mg/dL
Nitrite: NEGATIVE
PH: 5.5 (ref 5.0–8.0)
PROTEIN: NEGATIVE mg/dL
Specific Gravity, Urine: 1.018 (ref 1.005–1.030)
Urobilinogen, UA: 0.2 mg/dL (ref 0.0–1.0)

## 2014-02-24 LAB — APTT: aPTT: 28 seconds (ref 24–37)

## 2014-02-24 NOTE — Pre-Procedure Instructions (Addendum)
Shelby Hood  02/24/2014   Your procedure is scheduled on: 03/04/14  Report to [facility] short stay admitting at 530 AM.  Call this number if you have problems the morning of surgery: [phone]   Remember:   Do not eat food or drink liquids after midnight.   Take these medicines the morning of surgery with A SIP OF WATER: levothyroxine     Take all meds as ordered until day of surgery except as instructed below or per dr    Bridgette Habermann all herbel meds, nsaids (aleve,naproxen,advil,ibuprofen) 5 days prior to surgery(02/27/14) including vitamins, aspirin   Do not wear jewelry, make-up or nail polish.  Do not wear lotions, powders, or perfumes. You may wear deodorant.  Do not shave 48 hours prior to surgery. Men may shave face and neck.  Do not bring valuables to the hospital.  [facility] is not responsible                  for any belongings or valuables.               Contacts, dentures or bridgework may not be worn into surgery.  Leave suitcase in the car. After surgery it may be brought to your room.  For patients admitted to the hospital, discharge time is determined by your                treatment team.               Patients discharged the day of surgery will not be allowed to drive  home.  Name and phone number of your driver:   Special Instructions:  Special Instructions: [facility] - Preparing for Surgery  Before surgery, you can play an important role.  Because skin is not sterile, your skin needs to be as free of germs as possible.  You can reduce the number of germs on you skin by washing with CHG (chlorahexidine gluconate) soap before surgery.  CHG is an antiseptic cleaner which kills germs and bonds with the skin to continue killing germs even after washing.  Please DO NOT use if you have an allergy to CHG or antibacterial soaps.  If your skin becomes reddened/irritated stop using the CHG and inform your nurse when you arrive at Short Stay.  Do not shave (including legs  and underarms) for at least 48 hours prior to the first CHG shower.  You may shave your face.  Please follow these instructions carefully:   1.  Shower with CHG Soap the night before surgery and the morning of Surgery.  2.  If you choose to wash your hair, wash your hair first as usual with your normal shampoo.  3.  After you shampoo, rinse your hair and body thoroughly to remove the Shampoo.  4.  Use CHG as you would any other liquid soap.  You can apply chg directly  to the skin and wash gently with scrungie or a clean washcloth.  5.  Apply the CHG Soap to your body ONLY FROM THE NECK DOWN.  Do not use on open wounds or open sores.  Avoid contact with your eyes ears, mouth and genitals (private parts).  Wash genitals (private parts)       with your normal soap.  6.  Wash thoroughly, paying special attention to the area where your surgery will be performed.  7.  Thoroughly rinse your body with warm water from the neck down.  8.  DO NOT  shower/wash with your normal soap after using and rinsing off the CHG Soap.  9.  Pat yourself dry with a clean towel.            10.  Wear clean pajamas.            11.  Place clean sheets on your bed the night of your first shower and do not sleep with pets.  Day of Surgery  Do not apply any lotions/deodorants the morning of surgery.  Please wear clean clothes to the hospital/surgery center.   Please read over the following fact sheets that you were given: Pain Booklet, Coughing and Deep Breathing, Blood Transfusion Information and Surgical Site Infection Prevention

## 2014-02-24 NOTE — Progress Notes (Signed)
Patient refused to wear blood bracelet. req typ/cross day of surgery. Message left for stephanie at office

## 2014-02-25 ENCOUNTER — Ambulatory Visit
Admission: RE | Admit: 2014-02-25 | Discharge: 2014-02-25 | Disposition: A | Discharge status: Home / Self Care | Payer: Medicare Other | Source: Ambulatory Visit | Attending: General Surgery | Admitting: General Surgery

## 2014-02-25 DIAGNOSIS — R160 Hepatomegaly, not elsewhere classified: Secondary | ICD-10-CM

## 2014-02-25 MED ORDER — IOHEXOL 300 MG/ML  SOLN
100.0000 mL | Freq: Once | INTRAMUSCULAR | Status: AC | PRN
  Administered 2014-02-25: 100 mL via INTRAVENOUS

## 2014-02-26 ENCOUNTER — Telehealth: Payer: Self-pay | Admitting: Dietician

## 2014-02-26 ENCOUNTER — Other Ambulatory Visit (INDEPENDENT_AMBULATORY_CARE_PROVIDER_SITE_OTHER): Payer: Self-pay | Admitting: General Surgery

## 2014-02-26 ENCOUNTER — Other Ambulatory Visit (INDEPENDENT_AMBULATORY_CARE_PROVIDER_SITE_OTHER): Payer: Self-pay

## 2014-02-26 ENCOUNTER — Telehealth (INDEPENDENT_AMBULATORY_CARE_PROVIDER_SITE_OTHER): Payer: Self-pay

## 2014-02-26 ENCOUNTER — Telehealth (INDEPENDENT_AMBULATORY_CARE_PROVIDER_SITE_OTHER): Payer: Self-pay | Admitting: General Surgery

## 2014-02-26 DIAGNOSIS — C220 Liver cell carcinoma: Secondary | ICD-10-CM

## 2014-02-26 MED ORDER — LORAZEPAM 1 MG PO TABS: 1.0000 mg | ORAL_TABLET | Freq: Two times a day (BID) | ORAL | Status: DC

## 2014-02-26 NOTE — Telephone Encounter (Signed)
Brief Outpatient Oncology Nutrition Note  Patient has been identified to be at risk on malnutrition screen.  Wt Readings from Last 10 Encounters:  02/14/14 160 lb (72.576 kg)  01/29/14 162 lb (73.483 kg)  01/27/14 163 lb (73.936 kg)  01/22/14 164 lb 6 oz (74.56 kg)  01/17/14 164 lb 6.4 oz (74.571 kg)  01/09/14 167 lb 1.6 oz (75.796 kg)  12/18/13 167 lb (75.751 kg)  03/05/13 178 lb 3.2 oz (80.831 kg)  02/22/12 180 lb 11.2 oz (81.965 kg)  08/26/11 176 lb (79.833 kg)      Dx:  Malignant neoplasm of liver for surgery next week.  Called patient due to weight loss.  Patient was not available.  Left message with contact information for the [facility].  Antonieta Iba, RD, LDN

## 2014-02-26 NOTE — Telephone Encounter (Signed)
Called and left message on patient's voice mail, and tried calling back twice more.  Daughter called back and stated that her mother asked her to call me.    We discussed the bad results from her mom's CT.  She now has lesions all throughout her left lobe, and more lesions in her right lobe.  It is now an inoperable situation.  I discussed that we were going to cancel surgery.  I discussed that we would make referral back to IR for Y90 treatment.  I also reviewed that we went over her studies at GI conference and discussed options, which are not many.

## 2014-02-26 NOTE — Telephone Encounter (Signed)
Rx for Ativan 1mg  #30 BID called to [facility].  Pt's daughter made aware.

## 2014-02-27 ENCOUNTER — Ambulatory Visit
Admission: RE | Admit: 2014-02-27 | Discharge: 2014-02-27 | Disposition: A | Discharge status: Home / Self Care | Payer: Medicare Other | Source: Ambulatory Visit | Attending: General Surgery | Admitting: General Surgery

## 2014-02-27 DIAGNOSIS — C220 Liver cell carcinoma: Secondary | ICD-10-CM

## 2014-02-28 ENCOUNTER — Other Ambulatory Visit: Payer: Self-pay | Admitting: Interventional Radiology

## 2014-02-28 DIAGNOSIS — C220 Liver cell carcinoma: Secondary | ICD-10-CM

## 2014-03-01 ENCOUNTER — Other Ambulatory Visit: Payer: Self-pay | Admitting: Interventional Radiology

## 2014-03-01 ENCOUNTER — Other Ambulatory Visit (HOSPITAL_COMMUNITY): Payer: Self-pay | Admitting: Interventional Radiology

## 2014-03-01 DIAGNOSIS — C220 Liver cell carcinoma: Secondary | ICD-10-CM

## 2014-03-03 ENCOUNTER — Encounter (INDEPENDENT_AMBULATORY_CARE_PROVIDER_SITE_OTHER): Payer: Medicare Other | Admitting: General Surgery

## 2014-03-03 ENCOUNTER — Other Ambulatory Visit: Payer: Self-pay | Admitting: Interventional Radiology

## 2014-03-03 DIAGNOSIS — C220 Liver cell carcinoma: Secondary | ICD-10-CM

## 2014-03-04 ENCOUNTER — Encounter (HOSPITAL_COMMUNITY): Admission: RE | Payer: Self-pay | Source: Ambulatory Visit

## 2014-03-04 ENCOUNTER — Telehealth: Payer: Self-pay | Admitting: *Deleted

## 2014-03-04 ENCOUNTER — Inpatient Hospital Stay (HOSPITAL_COMMUNITY): Admission: RE | Admit: 2014-03-04 | Payer: Medicare Other | Source: Ambulatory Visit | Admitting: General Surgery

## 2014-03-04 SURGERY — LAPAROSCOPY, DIAGNOSTIC
Anesthesia: General | Laterality: Right

## 2014-03-04 NOTE — Telephone Encounter (Signed)
Returned call from [place] at [facility] in regards to when patient is seeing Dr. Benay Spice. Said Dr. Pascal Lux spoke with Dr. Benay Spice about this patient. Family has not heard from our office about the appointment with Dr. Benay Spice. Made Tammy aware that this is a current patient of Dr. Alvy Bimler and she was seen last on 01/27/14 and was sent for surgical referral. She has follow up appointment with Dr. Alvy Bimler on 8/12 that Dr. Benay Spice feels she needs to keep. Tammy reports that family want to change physicians. Made her aware that patient must call Dr. Alvy Bimler to inform her of the request and the doctors can discuss the transition. Told Tammy to be sure they are aware that Dr. Benay Spice will be out of office for several days and may take some time to get her in as a new patient. Tammy will call daughter.

## 2014-03-05 ENCOUNTER — Other Ambulatory Visit: Payer: Medicare Other

## 2014-03-05 ENCOUNTER — Other Ambulatory Visit: Payer: Self-pay | Admitting: Radiology

## 2014-03-05 ENCOUNTER — Ambulatory Visit: Payer: Medicare Other | Admitting: Hematology and Oncology

## 2014-03-06 ENCOUNTER — Encounter (HOSPITAL_COMMUNITY): Payer: Self-pay | Admitting: Pharmacy Technician

## 2014-03-07 ENCOUNTER — Encounter (INDEPENDENT_AMBULATORY_CARE_PROVIDER_SITE_OTHER): Payer: Medicare Other | Admitting: General Surgery

## 2014-03-10 ENCOUNTER — Encounter (HOSPITAL_COMMUNITY)
Admission: RE | Admit: 2014-03-10 | Discharge: 2014-03-10 | Disposition: A | Discharge status: Home / Self Care | Payer: Medicare Other | Source: Ambulatory Visit | Attending: Interventional Radiology | Admitting: Interventional Radiology

## 2014-03-10 ENCOUNTER — Other Ambulatory Visit: Payer: Self-pay | Admitting: Interventional Radiology

## 2014-03-10 ENCOUNTER — Ambulatory Visit (HOSPITAL_COMMUNITY)
Admission: RE | Admit: 2014-03-10 | Discharge: 2014-03-10 | Disposition: A | Discharge status: Home / Self Care | Payer: Medicare Other | Source: Ambulatory Visit | Attending: Interventional Radiology | Admitting: Interventional Radiology

## 2014-03-10 ENCOUNTER — Encounter (HOSPITAL_COMMUNITY): Payer: Self-pay

## 2014-03-10 VITALS — BP 137/60 | HR 80 | Temp 98.2°F | Resp 18 | Ht 68.0 in | Wt 157.2 lb

## 2014-03-10 DIAGNOSIS — C220 Liver cell carcinoma: Secondary | ICD-10-CM

## 2014-03-10 DIAGNOSIS — C228 Malignant neoplasm of liver, primary, unspecified as to type: Secondary | ICD-10-CM | POA: Diagnosis not present

## 2014-03-10 LAB — CBC WITH DIFFERENTIAL/PLATELET
BASOS ABS: 0.1 10*3/uL (ref 0.0–0.1)
Basophils Relative: 1 % (ref 0–1)
EOS ABS: 0.4 10*3/uL (ref 0.0–0.7)
Eosinophils Relative: 4 % (ref 0–5)
HCT: 37.5 % (ref 36.0–46.0)
Hemoglobin: 12.2 g/dL (ref 12.0–15.0)
Lymphocytes Relative: 17 % (ref 12–46)
Lymphs Abs: 1.6 10*3/uL (ref 0.7–4.0)
MCH: 28.8 pg (ref 26.0–34.0)
MCHC: 32.5 g/dL (ref 30.0–36.0)
MCV: 88.7 fL (ref 78.0–100.0)
MONO ABS: 0.7 10*3/uL (ref 0.1–1.0)
MONOS PCT: 7 % (ref 3–12)
NEUTROS PCT: 71 % (ref 43–77)
Neutro Abs: 6.7 10*3/uL (ref 1.7–7.7)
Platelets: 546 10*3/uL — ABNORMAL HIGH (ref 150–400)
RBC: 4.23 MIL/uL (ref 3.87–5.11)
RDW: 15.4 % (ref 11.5–15.5)
WBC: 9.4 10*3/uL (ref 4.0–10.5)

## 2014-03-10 LAB — COMPREHENSIVE METABOLIC PANEL
ALBUMIN: 3.3 g/dL — AB (ref 3.5–5.2)
ALT: 45 U/L — ABNORMAL HIGH (ref 0–35)
AST: 99 U/L — AB (ref 0–37)
Alkaline Phosphatase: 175 U/L — ABNORMAL HIGH (ref 39–117)
Anion gap: 16 — ABNORMAL HIGH (ref 5–15)
BILIRUBIN TOTAL: 0.9 mg/dL (ref 0.3–1.2)
BUN: 20 mg/dL (ref 6–23)
CHLORIDE: 99 meq/L (ref 96–112)
CO2: 22 mEq/L (ref 19–32)
CREATININE: 1.17 mg/dL — AB (ref 0.50–1.10)
Calcium: 9.5 mg/dL (ref 8.4–10.5)
GFR calc Af Amer: 53 mL/min — ABNORMAL LOW (ref >90)
GFR calc non Af Amer: 45 mL/min — ABNORMAL LOW (ref >90)
Glucose, Bld: 99 mg/dL (ref 70–99)
POTASSIUM: 4.7 meq/L (ref 3.7–5.3)
Sodium: 137 mEq/L (ref 137–147)
TOTAL PROTEIN: 6.9 g/dL (ref 6.0–8.3)

## 2014-03-10 LAB — APTT: APTT: 29 s (ref 24–37)

## 2014-03-10 LAB — PROTIME-INR
INR: 1.02 (ref 0.00–1.49)
Prothrombin Time: 13.4 seconds (ref 11.6–15.2)

## 2014-03-10 MED ORDER — DIPHENHYDRAMINE HCL 50 MG/ML IJ SOLN
INTRAMUSCULAR | Status: AC
  Filled 2014-03-10: qty 1

## 2014-03-10 MED ORDER — ONDANSETRON HCL 4 MG/2ML IJ SOLN
INTRAMUSCULAR | Status: AC
  Filled 2014-03-10: qty 2

## 2014-03-10 MED ORDER — ONDANSETRON HCL 4 MG/2ML IJ SOLN
INTRAMUSCULAR | Status: AC | PRN
  Administered 2014-03-10: 4 mg via INTRAVENOUS

## 2014-03-10 MED ORDER — SODIUM CHLORIDE 0.9 % IV SOLN: INTRAVENOUS | Status: DC

## 2014-03-10 MED ORDER — FENTANYL CITRATE 0.05 MG/ML IJ SOLN
INTRAMUSCULAR | Status: AC | PRN
  Administered 2014-03-10 (×4): 50 ug via INTRAVENOUS

## 2014-03-10 MED ORDER — IODIXANOL 320 MG/ML IV SOLN
100.0000 mL | Freq: Once | INTRAVENOUS | Status: AC | PRN
  Administered 2014-03-10: 1 mL via INTRA_ARTERIAL

## 2014-03-10 MED ORDER — MIDAZOLAM HCL 2 MG/2ML IJ SOLN
INTRAMUSCULAR | Status: AC
  Filled 2014-03-10: qty 6

## 2014-03-10 MED ORDER — MIDAZOLAM HCL 2 MG/2ML IJ SOLN
INTRAMUSCULAR | Status: AC | PRN
  Administered 2014-03-10 (×4): 1 mg via INTRAVENOUS

## 2014-03-10 MED ORDER — CEFAZOLIN SODIUM-DEXTROSE 2-3 GM-% IV SOLR
INTRAVENOUS | Status: AC
  Administered 2014-03-10: 2000 mg
  Filled 2014-03-10: qty 50

## 2014-03-10 MED ORDER — FENTANYL CITRATE 0.05 MG/ML IJ SOLN
INTRAMUSCULAR | Status: AC
  Filled 2014-03-10: qty 6

## 2014-03-10 MED ORDER — TECHNETIUM TO 99M ALBUMIN AGGREGATED
4.7000 | Freq: Once | INTRAVENOUS | Status: AC | PRN
  Administered 2014-03-10: 4.7 via INTRAVENOUS

## 2014-03-10 MED ORDER — DIPHENHYDRAMINE HCL 50 MG/ML IJ SOLN
INTRAMUSCULAR | Status: AC | PRN
  Administered 2014-03-10: 25 mg via INTRAVENOUS

## 2014-03-10 MED ORDER — IOHEXOL 300 MG/ML  SOLN: 80.0000 mL | Freq: Once | INTRAMUSCULAR | Status: DC | PRN

## 2014-03-10 NOTE — Discharge Instructions (Signed)
IF YOU ARE GOING TO BE 15 OR MINUTES LATE FOR YOUR APPOINTMENT, PLEASE CALL [phone] TO MAKE OTHER ARRANGEMENTS FOR YOUR TREATMENT  IF YOU ARRIVE EARLY FOR YOUR SCHEDULED APPOINTMENT , YOU MAY HAVE TO WAIT UNTIL YOUR SCHEDULED TIME.Conscious Sedation, Adult, Care After Refer to this sheet in the next few weeks. These instructions provide you with information on caring for yourself after your procedure. Your health care provider may also give you more specific instructions. Your treatment has been planned according to current medical practices, but problems sometimes occur. Call your health care provider if you have any problems or questions after your procedure. WHAT TO EXPECT AFTER THE PROCEDURE  After your procedure:  You may feel sleepy, clumsy, and have poor balance for several hours.  Vomiting may occur if you eat too soon after the procedure. HOME CARE INSTRUCTIONS  Do not participate in any activities where you could become injured for at least 24 hours. Do not:  Drive.  Swim.  Ride a bicycle.  Operate heavy machinery.  Cook.  Use power tools.  Climb ladders.  Work from a high place.  Do not make important decisions or sign legal documents until you are improved.  If you vomit, drink water, juice, or soup when you can drink without vomiting. Make sure you have little or no nausea before eating solid foods.  Only take over-the-counter or prescription medicines for pain, discomfort, or fever as directed by your health care provider.  Make sure you and your family fully understand everything about the medicines given to you, including what side effects may occur.  You should not drink alcohol, take sleeping pills, or take medicines that cause drowsiness for at least 24 hours.  If you smoke, do not smoke without supervision.  If you are feeling better, you may resume normal activities 24 hours after you were sedated.  Keep all appointments with your health care  provider. SEEK MEDICAL CARE IF:  Your skin is pale or bluish in color.  You continue to feel nauseous or vomit.  Your pain is getting worse and is not helped by medicine.  You have bleeding or swelling.  You are still sleepy or feeling clumsy after 24 hours. SEEK IMMEDIATE MEDICAL CARE IF:  You develop a rash.  You have difficulty breathing.  You develop any type of allergic problem.  You have a fever. MAKE SURE YOU:  Understand these instructions.  Will watch your condition.  Will get help right away if you are not doing well or get worse. Document Released: 05/01/2013 Document Reviewed: 05/01/2013 [facility] Patient Information 2015 [place], [place]. This information is not intended to replace advice given to you by your health care provider. Make sure you discuss any questions you have with your health care provider. Conscious Sedation, Adult, Care After Refer to this sheet in the next few weeks. These instructions provide you with information on caring for yourself after your procedure. Your health care provider may also give you more specific instructions. Your treatment has been planned according to current medical practices, but problems sometimes occur. Call your health care provider if you have any problems or questions after your procedure. WHAT TO EXPECT AFTER THE PROCEDURE  After your procedure:  You may feel sleepy, clumsy, and have poor balance for several hours.  Vomiting may occur if you eat too soon after the procedure. HOME CARE INSTRUCTIONS  Do not participate in any activities where you could become injured for at least 24 hours. Do not:  Drive.  Swim.  Ride a bicycle.  Operate heavy machinery.  Cook.  Use power tools.  Climb ladders.  Work from a high place.  Do not make important decisions or sign legal documents until you are improved.  If you vomit, drink water, juice, or soup when you can drink without vomiting. Make sure you have  little or no nausea before eating solid foods.  Only take over-the-counter or prescription medicines for pain, discomfort, or fever as directed by your health care provider.  Make sure you and your family fully understand everything about the medicines given to you, including what side effects may occur.  You should not drink alcohol, take sleeping pills, or take medicines that cause drowsiness for at least 24 hours.  If you smoke, do not smoke without supervision.  If you are feeling better, you may resume normal activities 24 hours after you were sedated.  Keep all appointments with your health care provider. SEEK MEDICAL CARE IF:  Your skin is pale or bluish in color.  You continue to feel nauseous or vomit.  Your pain is getting worse and is not helped by medicine.  You have bleeding or swelling.  You are still sleepy or feeling clumsy after 24 hours. SEEK IMMEDIATE MEDICAL CARE IF:  You develop a rash.  You have difficulty breathing.  You develop any type of allergic problem.  You have a fever. MAKE SURE YOU:  Understand these instructions.  Will watch your condition.  Will get help right away if you are not doing well or get worse. Document Released: 05/01/2013 Document Reviewed: 05/01/2013 [facility] Patient Information 2015 [place], [place]. This information is not intended to replace advice given to you by your health care provider. Make sure you discuss any questions you have with your health care provider.

## 2014-03-10 NOTE — H&P (Signed)
Chief Complaint: "I am here for a procedure of my liver." Referring Physician: Dr. Barry Dienes  HPI: Shelby Hood is an 73 y.o. female with biopsy proven [place] s/p right portal vein embolization on 01/29/14 to promote enhanced growth of the healthy left lobe of the liver prior to any right lobe resection. 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 Patient is no longer a candidate for surgical resection. She has been seen in consult 02/28/14 to discuss further options. She is scheduled today for pre Y-90. She denies any nausea, abdominal pain, fever or chills. She denies any chest pain, shortness of breath or palpitations. She denies any active signs of bleeding or excessive bruising. The patient denies any history of sleep apnea or chronic oxygen use. She has previously tolerated sedation without complications.    Past Medical History:  Past Medical History  Diagnosis Date  . Osteoarthritis   . Hypertension   . Hypertriglyceridemia   . History of breast cancer 02/22/2012  . Breast cancer 01/2009  . Melanoma 2006    stage I; right anterior chest wall; s/p resection   . Hypothyroidism     Past Surgical History:  Past Surgical History  Procedure Laterality Date  . Skin biopsy    . Ectopic pregnancy surgery    . Tubal ligation    . Breast lumpectomy      bilateral  . Melanoma excision    . Tonsillectomy      Family History:  Family History  Problem Relation Age of Onset  . Cancer Mother     breast ca  . Cancer Brother     ?lung ca    Social History:  reports that she quit smoking about 47 years ago. Her smoking use included Cigarettes. She has a 2 pack-year smoking history. She has never used smokeless tobacco. She reports that she drinks about 4.2 ounces of alcohol per week. She reports that she does not use illicit drugs.  Allergies:  Allergies  Allergen Reactions  . Codeine Other (See Comments)    Mental upset   . Tetanus  Toxoids Swelling    Medications:   Medication List    ASK your doctor about these medications       ibuprofen 200 MG tablet  Commonly known as:  ADVIL,MOTRIN  Take 400-800 mg by mouth every 6 (six) hours as needed for headache or moderate pain.     levothyroxine 100 MCG tablet  Commonly known as:  SYNTHROID, LEVOTHROID  Take 100 mcg by mouth daily before breakfast.     LORazepam 1 MG tablet  Commonly known as:  ATIVAN  Take 1 mg by mouth 2 (two) times daily as needed for anxiety or sleep.     losartan 100 MG tablet  Commonly known as:  COZAAR  Take 100 mg by mouth every morning.       Please HPI for pertinent positives, otherwise complete 10 system ROS negative.  Physical Exam: BP 116/51  Pulse 86  Temp(Src) 97.8 F (36.6 C) (Oral)  Resp 16  Ht 5' 8"  (1.727 m)  Wt 157 lb 4 oz (71.328 kg)  BMI 23.92 kg/m2  SpO2 98% Body mass index is 23.92 kg/(m^2).  General Appearance:  Alert, cooperative, no distress  Head:  Normocephalic, without obvious abnormality, atraumatic  Neck: Supple, symmetrical, trachea midline  Lungs:   Clear to auscultation bilaterally, no w/r/r, respirations unlabored without use of accessory muscles.  Chest Wall:  No tenderness  or deformity  Heart:  Regular rate and rhythm, S1, S2 normal, no murmur, rub or gallop.  Abdomen:   Soft, non-tender, non distended, no fluid wave.  Extremities: Extremities normal, atraumatic, no cyanosis or edema  Pulses: 1+ and symmetric  Neurologic: Normal affect, no gross deficits.   Results for orders placed during the hospital encounter of 03/10/14 (from the past 48 hour(s))  APTT     Status: None   Collection Time    03/10/14  8:00 AM      Result Value Ref Range   aPTT 29  24 - 37 seconds  CBC WITH DIFFERENTIAL     Status: Abnormal   Collection Time    03/10/14  8:00 AM      Result Value Ref Range   WBC 9.4  4.0 - 10.5 K/uL   RBC 4.23  3.87 - 5.11 MIL/uL   Hemoglobin 12.2  12.0 - 15.0 g/dL   HCT 37.5  36.0  - 46.0 %   MCV 88.7  78.0 - 100.0 fL   MCH 28.8  26.0 - 34.0 pg   MCHC 32.5  30.0 - 36.0 g/dL   RDW 15.4  11.5 - 15.5 %   Platelets 546 (*) 150 - 400 K/uL   Neutrophils Relative % 71  43 - 77 %   Neutro Abs 6.7  1.7 - 7.7 K/uL   Lymphocytes Relative 17  12 - 46 %   Lymphs Abs 1.6  0.7 - 4.0 K/uL   Monocytes Relative 7  3 - 12 %   Monocytes Absolute 0.7  0.1 - 1.0 K/uL   Eosinophils Relative 4  0 - 5 %   Eosinophils Absolute 0.4  0.0 - 0.7 K/uL   Basophils Relative 1  0 - 1 %   Basophils Absolute 0.1  0.0 - 0.1 K/uL  COMPREHENSIVE METABOLIC PANEL     Status: Abnormal   Collection Time    03/10/14  8:00 AM      Result Value Ref Range   Sodium 137  137 - 147 mEq/L   Potassium 4.7  3.7 - 5.3 mEq/L   Chloride 99  96 - 112 mEq/L   CO2 22  19 - 32 mEq/L   Glucose, Bld 99  70 - 99 mg/dL   BUN 20  6 - 23 mg/dL   Creatinine, Ser 1.17 (*) 0.50 - 1.10 mg/dL   Calcium 9.5  8.4 - 10.5 mg/dL   Total Protein 6.9  6.0 - 8.3 g/dL   Albumin 3.3 (*) 3.5 - 5.2 g/dL   AST 99 (*) 0 - 37 U/L   ALT 45 (*) 0 - 35 U/L   Alkaline Phosphatase 175 (*) 39 - 117 U/L   Total Bilirubin 0.9  0.3 - 1.2 mg/dL   GFR calc non Af Amer 45 (*) >90 mL/min   GFR calc Af Amer 53 (*) >90 mL/min   Comment: (NOTE)     The eGFR has been calculated using the CKD EPI equation.     This calculation has not been validated in all clinical situations.     eGFR's persistently <90 mL/min signify possible Chronic Kidney     Disease.   Anion gap 16 (*) 5 - 15  PROTIME-INR     Status: None   Collection Time    03/10/14  8:00 AM      Result Value Ref Range   Prothrombin Time 13.4  11.6 - 15.2 seconds   INR 1.02  0.00 -  1.49   No results found.  Assessment/Plan HCC, biopsy proven. S/p right portal vein embolization  Recent imaging demonstrates marked progression of HCC right and left lobe, no longer surgical candidate.  Seen in consult 02/28/14, scheduled today for pre Y-90 treatment to right lobe of the liver. Patient has  been NPO, no blood thinners taken, labs and images reviewed. Risks and Benefits discussed with the patient. All of the patient's questions were answered, patient is agreeable to proceed. Consent signed and in chart.   Tsosie Billing D PA-C 03/10/2014, 8:48 AM

## 2014-03-10 NOTE — Sedation Documentation (Signed)
Pt appeared red and flush. She c/o feeling 'warm'. Meds given per MD. Both appearence and pt feeling of warmth returning back to baseline.

## 2014-03-10 NOTE — Procedures (Signed)
Interventional Radiology Procedure Note  Procedure:  1.) Mapping hepatic arteriogram 2.) Coil embo of GDA 3.) Injection of MAA to evaluate hepatopulmonary shunt fraction Complications: None immediate Recommendations: - Bedrest x 4 hrs - to Nucs for hepatopulmonary shunt eval  Signed,  Criselda Peaches, MD [facility]

## 2014-03-10 NOTE — H&P (Signed)
Chief Complaint: "I am here for a procedure of my liver." Referring Physician: Dr. Barry Dienes  HPI: Shelby Hood is an 73 y.o. female with biopsy proven [place] s/p right portal vein embolization on 01/29/14 to promote enhanced growth of the healthy left lobe of the liver prior to any right lobe resection. 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 Patient is no longer a candidate for surgical resection. She has been seen in consult 02/28/14 to discuss further options. She is scheduled today for pre Y-90. She denies any nausea, abdominal pain, fever or chills. She denies any chest pain, shortness of breath or palpitations. She denies any active signs of bleeding or excessive bruising. The patient denies any history of sleep apnea or chronic oxygen use. She has previously tolerated sedation without complications.    Past Medical History:  Past Medical History  Diagnosis Date  . Osteoarthritis   . Hypertension   . Hypertriglyceridemia   . History of breast cancer 02/22/2012  . Breast cancer 01/2009  . Melanoma 2006    stage I; right anterior chest wall; s/p resection   . Hypothyroidism     Past Surgical History:  Past Surgical History  Procedure Laterality Date  . Skin biopsy    . Ectopic pregnancy surgery    . Tubal ligation    . Breast lumpectomy      bilateral  . Melanoma excision    . Tonsillectomy      Family History:  Family History  Problem Relation Age of Onset  . Cancer Mother     breast ca  . Cancer Brother     ?lung ca    Social History:  reports that she quit smoking about 47 years ago. Her smoking use included Cigarettes. She has a 2 pack-year smoking history. She has never used smokeless tobacco. She reports that she drinks about 4.2 ounces of alcohol per week. She reports that she does not use illicit drugs.  Allergies:  Allergies  Allergen Reactions  . Codeine Other (See Comments)    Mental upset   . Tetanus  Toxoids Swelling    Medications:   Medication List    ASK your doctor about these medications       ibuprofen 200 MG tablet  Commonly known as:  ADVIL,MOTRIN  Take 400-800 mg by mouth every 6 (six) hours as needed for headache or moderate pain.     levothyroxine 100 MCG tablet  Commonly known as:  SYNTHROID, LEVOTHROID  Take 100 mcg by mouth daily before breakfast.     LORazepam 1 MG tablet  Commonly known as:  ATIVAN  Take 1 mg by mouth 2 (two) times daily as needed for anxiety or sleep.     losartan 100 MG tablet  Commonly known as:  COZAAR  Take 100 mg by mouth every morning.       Please HPI for pertinent positives, otherwise complete 10 system ROS negative.  Physical Exam: BP 116/51  Pulse 86  Temp(Src) 97.8 F (36.6 C) (Oral)  Resp 16  Ht 5' 8"  (1.727 m)  Wt 157 lb 4 oz (71.328 kg)  BMI 23.92 kg/m2  SpO2 98% Body mass index is 23.92 kg/(m^2).  General Appearance:  Alert, cooperative, no distress  Head:  Normocephalic, without obvious abnormality, atraumatic  Neck: Supple, symmetrical, trachea midline  Lungs:   Clear to auscultation bilaterally, no w/r/r, respirations unlabored without use of accessory muscles.  Chest Wall:  No tenderness  or deformity  Heart:  Regular rate and rhythm, S1, S2 normal, no murmur, rub or gallop.  Abdomen:   Soft, non-tender, non distended, no fluid wave.  Extremities: Extremities normal, atraumatic, no cyanosis or edema  Pulses: 1+ and symmetric  Neurologic: Normal affect, no gross deficits.   Results for orders placed during the hospital encounter of 03/10/14 (from the past 48 hour(s))  APTT     Status: None   Collection Time    03/10/14  8:00 AM      Result Value Ref Range   aPTT 29  24 - 37 seconds  CBC WITH DIFFERENTIAL     Status: Abnormal   Collection Time    03/10/14  8:00 AM      Result Value Ref Range   WBC 9.4  4.0 - 10.5 K/uL   RBC 4.23  3.87 - 5.11 MIL/uL   Hemoglobin 12.2  12.0 - 15.0 g/dL   HCT 37.5  36.0  - 46.0 %   MCV 88.7  78.0 - 100.0 fL   MCH 28.8  26.0 - 34.0 pg   MCHC 32.5  30.0 - 36.0 g/dL   RDW 15.4  11.5 - 15.5 %   Platelets 546 (*) 150 - 400 K/uL   Neutrophils Relative % 71  43 - 77 %   Neutro Abs 6.7  1.7 - 7.7 K/uL   Lymphocytes Relative 17  12 - 46 %   Lymphs Abs 1.6  0.7 - 4.0 K/uL   Monocytes Relative 7  3 - 12 %   Monocytes Absolute 0.7  0.1 - 1.0 K/uL   Eosinophils Relative 4  0 - 5 %   Eosinophils Absolute 0.4  0.0 - 0.7 K/uL   Basophils Relative 1  0 - 1 %   Basophils Absolute 0.1  0.0 - 0.1 K/uL  COMPREHENSIVE METABOLIC PANEL     Status: Abnormal   Collection Time    03/10/14  8:00 AM      Result Value Ref Range   Sodium 137  137 - 147 mEq/L   Potassium 4.7  3.7 - 5.3 mEq/L   Chloride 99  96 - 112 mEq/L   CO2 22  19 - 32 mEq/L   Glucose, Bld 99  70 - 99 mg/dL   BUN 20  6 - 23 mg/dL   Creatinine, Ser 1.17 (*) 0.50 - 1.10 mg/dL   Calcium 9.5  8.4 - 10.5 mg/dL   Total Protein 6.9  6.0 - 8.3 g/dL   Albumin 3.3 (*) 3.5 - 5.2 g/dL   AST 99 (*) 0 - 37 U/L   ALT 45 (*) 0 - 35 U/L   Alkaline Phosphatase 175 (*) 39 - 117 U/L   Total Bilirubin 0.9  0.3 - 1.2 mg/dL   GFR calc non Af Amer 45 (*) >90 mL/min   GFR calc Af Amer 53 (*) >90 mL/min   Comment: (NOTE)     The eGFR has been calculated using the CKD EPI equation.     This calculation has not been validated in all clinical situations.     eGFR's persistently <90 mL/min signify possible Chronic Kidney     Disease.   Anion gap 16 (*) 5 - 15  PROTIME-INR     Status: None   Collection Time    03/10/14  8:00 AM      Result Value Ref Range   Prothrombin Time 13.4  11.6 - 15.2 seconds   INR 1.02  0.00 -  1.49   No results found.  Assessment/Plan HCC, biopsy proven. S/p right portal vein embolization  Recent imaging demonstrates marked progression of HCC right and left lobe, no longer surgical candidate.  Seen in consult 02/28/14, scheduled today for pre Y-90 treatment to right lobe of the liver. Patient has  been NPO, no blood thinners taken, labs and images reviewed. Risks and Benefits discussed with the patient. All of the patient's questions were answered, patient is agreeable to proceed. Consent signed and in chart.   Tsosie Billing D PA-C 03/10/2014, 8:48 AM  I agree with the PA note above.  Signed,  Criselda Peaches, MD [facility]

## 2014-03-12 ENCOUNTER — Other Ambulatory Visit (HOSPITAL_COMMUNITY): Payer: Self-pay | Admitting: Interventional Radiology

## 2014-03-12 ENCOUNTER — Other Ambulatory Visit: Payer: Self-pay | Admitting: Interventional Radiology

## 2014-03-12 DIAGNOSIS — C220 Liver cell carcinoma: Secondary | ICD-10-CM

## 2014-03-13 ENCOUNTER — Other Ambulatory Visit: Payer: Self-pay | Admitting: Radiology

## 2014-03-13 ENCOUNTER — Encounter (HOSPITAL_COMMUNITY): Payer: Self-pay | Admitting: Pharmacy Technician

## 2014-03-14 ENCOUNTER — Other Ambulatory Visit: Payer: Self-pay | Admitting: Radiology

## 2014-03-16 ENCOUNTER — Other Ambulatory Visit: Payer: Self-pay | Admitting: Radiology

## 2014-03-17 ENCOUNTER — Other Ambulatory Visit: Payer: Self-pay | Admitting: Interventional Radiology

## 2014-03-17 ENCOUNTER — Encounter (HOSPITAL_COMMUNITY): Payer: Self-pay

## 2014-03-17 ENCOUNTER — Ambulatory Visit (HOSPITAL_COMMUNITY)
Admission: RE | Admit: 2014-03-17 | Discharge: 2014-03-17 | Disposition: A | Discharge status: Home / Self Care | Payer: Medicare Other | Source: Ambulatory Visit | Attending: Interventional Radiology | Admitting: Interventional Radiology

## 2014-03-17 ENCOUNTER — Encounter (HOSPITAL_COMMUNITY)
Admission: RE | Admit: 2014-03-17 | Discharge: 2014-03-17 | Disposition: A | Discharge status: Home / Self Care | Payer: Medicare Other | Source: Ambulatory Visit | Attending: Interventional Radiology | Admitting: Interventional Radiology

## 2014-03-17 DIAGNOSIS — C228 Malignant neoplasm of liver, primary, unspecified as to type: Secondary | ICD-10-CM | POA: Insufficient documentation

## 2014-03-17 DIAGNOSIS — I10 Essential (primary) hypertension: Secondary | ICD-10-CM | POA: Diagnosis not present

## 2014-03-17 DIAGNOSIS — E039 Hypothyroidism, unspecified: Secondary | ICD-10-CM | POA: Insufficient documentation

## 2014-03-17 DIAGNOSIS — Z853 Personal history of malignant neoplasm of breast: Secondary | ICD-10-CM | POA: Diagnosis not present

## 2014-03-17 DIAGNOSIS — C220 Liver cell carcinoma: Secondary | ICD-10-CM

## 2014-03-17 DIAGNOSIS — Z87891 Personal history of nicotine dependence: Secondary | ICD-10-CM | POA: Diagnosis not present

## 2014-03-17 DIAGNOSIS — M1990 Unspecified osteoarthritis, unspecified site: Secondary | ICD-10-CM | POA: Insufficient documentation

## 2014-03-17 DIAGNOSIS — E785 Hyperlipidemia, unspecified: Secondary | ICD-10-CM | POA: Diagnosis not present

## 2014-03-17 LAB — CBC WITH DIFFERENTIAL/PLATELET
Basophils Absolute: 0 10*3/uL (ref 0.0–0.1)
Basophils Relative: 1 % (ref 0–1)
EOS ABS: 0.3 10*3/uL (ref 0.0–0.7)
EOS PCT: 3 % (ref 0–5)
HCT: 33.7 % — ABNORMAL LOW (ref 36.0–46.0)
Hemoglobin: 11.1 g/dL — ABNORMAL LOW (ref 12.0–15.0)
Lymphocytes Relative: 14 % (ref 12–46)
Lymphs Abs: 1.2 10*3/uL (ref 0.7–4.0)
MCH: 29.7 pg (ref 26.0–34.0)
MCHC: 32.9 g/dL (ref 30.0–36.0)
MCV: 90.1 fL (ref 78.0–100.0)
MONO ABS: 0.7 10*3/uL (ref 0.1–1.0)
Monocytes Relative: 9 % (ref 3–12)
Neutro Abs: 6.4 10*3/uL (ref 1.7–7.7)
Neutrophils Relative %: 73 % (ref 43–77)
PLATELETS: 437 10*3/uL — AB (ref 150–400)
RBC: 3.74 MIL/uL — ABNORMAL LOW (ref 3.87–5.11)
RDW: 15.3 % (ref 11.5–15.5)
WBC: 8.7 10*3/uL (ref 4.0–10.5)

## 2014-03-17 LAB — COMPREHENSIVE METABOLIC PANEL
ALT: 40 U/L — AB (ref 0–35)
AST: 83 U/L — AB (ref 0–37)
Albumin: 2.7 g/dL — ABNORMAL LOW (ref 3.5–5.2)
Alkaline Phosphatase: 172 U/L — ABNORMAL HIGH (ref 39–117)
Anion gap: 15 (ref 5–15)
BUN: 15 mg/dL (ref 6–23)
CO2: 21 mEq/L (ref 19–32)
CREATININE: 0.84 mg/dL (ref 0.50–1.10)
Calcium: 8.9 mg/dL (ref 8.4–10.5)
Chloride: 100 mEq/L (ref 96–112)
GFR calc non Af Amer: 68 mL/min — ABNORMAL LOW (ref >90)
GFR, EST AFRICAN AMERICAN: 79 mL/min — AB (ref >90)
Glucose, Bld: 99 mg/dL (ref 70–99)
Potassium: 4.1 mEq/L (ref 3.7–5.3)
SODIUM: 136 meq/L — AB (ref 137–147)
TOTAL PROTEIN: 6.2 g/dL (ref 6.0–8.3)
Total Bilirubin: 0.7 mg/dL (ref 0.3–1.2)

## 2014-03-17 LAB — PROTIME-INR
INR: 1 (ref 0.00–1.49)
Prothrombin Time: 13.2 seconds (ref 11.6–15.2)

## 2014-03-17 MED ORDER — SODIUM CHLORIDE 0.9 % IV SOLN
INTRAVENOUS | Status: DC
  Administered 2014-03-17: 08:00:00 via INTRAVENOUS

## 2014-03-17 MED ORDER — MIDAZOLAM HCL 2 MG/2ML IJ SOLN
INTRAMUSCULAR | Status: AC | PRN
  Administered 2014-03-17 (×3): 1 mg via INTRAVENOUS
  Administered 2014-03-17: 2 mg via INTRAVENOUS

## 2014-03-17 MED ORDER — MIDAZOLAM HCL 2 MG/2ML IJ SOLN
INTRAMUSCULAR | Status: AC
  Filled 2014-03-17: qty 8

## 2014-03-17 MED ORDER — FENTANYL CITRATE 0.05 MG/ML IJ SOLN
INTRAMUSCULAR | Status: DC
Start: 2014-03-17 — End: 2014-03-18
  Filled 2014-03-17: qty 8

## 2014-03-17 MED ORDER — TECHNETIUM TO 99M ALBUMIN AGGREGATED
6.3000 | Freq: Once | INTRAVENOUS | Status: AC | PRN
  Administered 2014-03-17: 6 via INTRAVENOUS

## 2014-03-17 MED ORDER — IOHEXOL 300 MG/ML  SOLN: 100.0000 mL | Freq: Once | INTRAMUSCULAR | Status: DC | PRN

## 2014-03-17 MED ORDER — LIDOCAINE HCL 1 % IJ SOLN
INTRAMUSCULAR | Status: AC
  Filled 2014-03-17: qty 20

## 2014-03-17 MED ORDER — ONDANSETRON 8 MG/NS 50 ML IVPB
8.0000 mg | Freq: Four times a day (QID) | INTRAVENOUS | Status: DC | PRN
  Filled 2014-03-17: qty 8

## 2014-03-17 MED ORDER — PIPERACILLIN-TAZOBACTAM 3.375 G IVPB
3.3750 g | Freq: Once | INTRAVENOUS | Status: AC
  Administered 2014-03-17: 3.375 g via INTRAVENOUS
  Filled 2014-03-17: qty 50

## 2014-03-17 MED ORDER — GELATIN ABSORBABLE 12-7 MM EX MISC
CUTANEOUS | Status: AC
  Filled 2014-03-17: qty 1

## 2014-03-17 MED ORDER — GELATIN ABSORBABLE 12-7 MM EX MISC
1.0000 | Freq: Once | CUTANEOUS | Status: AC
  Administered 2014-03-17: 1 via TOPICAL

## 2014-03-17 MED ORDER — FENTANYL CITRATE 0.05 MG/ML IJ SOLN
INTRAMUSCULAR | Status: AC | PRN
Start: 2014-03-17 — End: 2014-03-17
  Administered 2014-03-17 (×5): 50 ug via INTRAVENOUS

## 2014-03-17 NOTE — H&P (Signed)
Shelby Hood is an 73 y.o. female.   Chief Complaint:  Pt with hx Hepatocellular cancer Rt Portal vein embolization performed 01/29/14 to promote growth of healthy Left lobe liver prior to Rt lobe resection. Unfortunately, developed progression of disease. Now not candidate for surgery at all Determined candidate instead for Y90 treatment of hepatocellular cancer 02/28/14 underwent pre Y90 procedure. Noted NM lung shunt study percentage was high at 28%. Now scheduled for Mesenteric arteriogram with Bland embolization to include injection of MAA to repeat lung shunt study. Hopefully lung shunt study % will be lower and we will be able to have pt return for Y90 treatment at later date.  HPI: [place]; osteoarthritis; HTN; HLD; Hx Breast Ca; Melanoma; hypothyroidism  Past Medical History  Diagnosis Date  . Osteoarthritis   . Hypertension   . Hypertriglyceridemia   . History of breast cancer 02/22/2012  . Breast cancer 01/2009  . Melanoma 2006    stage I; right anterior chest wall; s/p resection   . Hypothyroidism     Past Surgical History  Procedure Laterality Date  . Skin biopsy    . Ectopic pregnancy surgery    . Tubal ligation    . Breast lumpectomy      bilateral  . Melanoma excision    . Tonsillectomy      Family History  Problem Relation Age of Onset  . Cancer Mother     breast ca  . Cancer Brother     ?lung ca   Social History:  reports that she quit smoking about 47 years ago. Her smoking use included Cigarettes. She has a 2 pack-year smoking history. She has never used smokeless tobacco. She reports that she drinks about 4.2 ounces of alcohol per week. She reports that she does not use illicit drugs.  Allergies:  Allergies  Allergen Reactions  . Codeine Other (See Comments)    Mental upset   . Tetanus Toxoids Swelling     (Not in a hospital admission)  Results for orders placed during the hospital encounter of 03/17/14 (from the past 48 hour(s))  CBC WITH  DIFFERENTIAL     Status: Abnormal   Collection Time    03/17/14  8:15 AM      Result Value Ref Range   WBC 8.7  4.0 - 10.5 K/uL   RBC 3.74 (*) 3.87 - 5.11 MIL/uL   Hemoglobin 11.1 (*) 12.0 - 15.0 g/dL   HCT 33.7 (*) 36.0 - 46.0 %   MCV 90.1  78.0 - 100.0 fL   MCH 29.7  26.0 - 34.0 pg   MCHC 32.9  30.0 - 36.0 g/dL   RDW 15.3  11.5 - 15.5 %   Platelets 437 (*) 150 - 400 K/uL   Neutrophils Relative % 73  43 - 77 %   Neutro Abs 6.4  1.7 - 7.7 K/uL   Lymphocytes Relative 14  12 - 46 %   Lymphs Abs 1.2  0.7 - 4.0 K/uL   Monocytes Relative 9  3 - 12 %   Monocytes Absolute 0.7  0.1 - 1.0 K/uL   Eosinophils Relative 3  0 - 5 %   Eosinophils Absolute 0.3  0.0 - 0.7 K/uL   Basophils Relative 1  0 - 1 %   Basophils Absolute 0.0  0.0 - 0.1 K/uL  COMPREHENSIVE METABOLIC PANEL     Status: Abnormal   Collection Time    03/17/14  8:15 AM      Result Value  Ref Range   Sodium 136 (*) 137 - 147 mEq/L   Potassium 4.1  3.7 - 5.3 mEq/L   Chloride 100  96 - 112 mEq/L   CO2 21  19 - 32 mEq/L   Glucose, Bld 99  70 - 99 mg/dL   BUN 15  6 - 23 mg/dL   Creatinine, Ser 0.84  0.50 - 1.10 mg/dL   Calcium 8.9  8.4 - 10.5 mg/dL   Total Protein 6.2  6.0 - 8.3 g/dL   Albumin 2.7 (*) 3.5 - 5.2 g/dL   AST 83 (*) 0 - 37 U/L   ALT 40 (*) 0 - 35 U/L   Alkaline Phosphatase 172 (*) 39 - 117 U/L   Total Bilirubin 0.7  0.3 - 1.2 mg/dL   GFR calc non Af Amer 68 (*) >90 mL/min   GFR calc Af Amer 79 (*) >90 mL/min   Comment: (NOTE)     The eGFR has been calculated using the CKD EPI equation.     This calculation has not been validated in all clinical situations.     eGFR's persistently <90 mL/min signify possible Chronic Kidney     Disease.   Anion gap 15  5 - 15  PROTIME-INR     Status: None   Collection Time    03/17/14  8:15 AM      Result Value Ref Range   Prothrombin Time 13.2  11.6 - 15.2 seconds   INR 1.00  0.00 - 1.49   No results found.  Review of Systems  Constitutional: Positive for weight  loss. Negative for fever.  Respiratory: Negative for hemoptysis and shortness of breath.   Cardiovascular: Negative for chest pain.  Gastrointestinal: Positive for nausea and abdominal pain.  Musculoskeletal: Negative for back pain.  Neurological: Positive for weakness. Negative for dizziness and headaches.  Psychiatric/Behavioral: Negative for substance abuse.    Blood pressure 133/56, pulse 79, temperature 98.2 F (36.8 C), temperature source Oral, resp. rate 18, SpO2 96.00%. Physical Exam  Constitutional: She is oriented to person, place, and time. She appears well-developed and well-nourished.  Cardiovascular: Normal rate, regular rhythm and normal heart sounds.   No murmur heard. Respiratory: Effort normal and breath sounds normal. She has no wheezes.  GI: Soft. Bowel sounds are normal. There is tenderness.  Musculoskeletal: Normal range of motion.  Neurological: She is alert and oriented to person, place, and time.  Skin: Skin is warm and dry.  Psychiatric: She has a normal mood and affect. Her behavior is normal. Judgment and thought content normal.     Assessment/Plan Hx HCC Was scheduled for Rt lobe resection Prior to surgery R portal V was embolized to promote new healthy growth of L liver Unfortunately, progression of disease has occurred. Pre Y90  8/7 revealed too high lung shunt percentage Now scheduled for mesenteric arteriogram with bland embo and injection of MAA for new calculation of kung shunt percentage. Possible Y90 treatment later date Pt and dtr aware of procedure benefits and risks and agreeable to proceed Consent signed andin chart  Gavino Fouch A 03/17/2014, 9:10 AM

## 2014-03-17 NOTE — Discharge Instructions (Signed)
Conscious Sedation, Adult, Care After Refer to this sheet in the next few weeks. These instructions provide you with information on caring for yourself after your procedure. Your health care provider may also give you more specific instructions. Your treatment has been planned according to current medical practices, but problems sometimes occur. Call your health care provider if you have any problems or questions after your procedure. WHAT TO EXPECT AFTER THE PROCEDURE  After your procedure:  You may feel sleepy, clumsy, and have poor balance for several hours.  Vomiting may occur if you eat too soon after the procedure. HOME CARE INSTRUCTIONS  Do not participate in any activities where you could become injured for at least 24 hours. Do not:  Drive.  Swim.  Ride a bicycle.  Operate heavy machinery.  Cook.  Use power tools.  Climb ladders.  Work from a high place.  Do not make important decisions or sign legal documents until you are improved.  If you vomit, drink water, juice, or soup when you can drink without vomiting. Make sure you have little or no nausea before eating solid foods.  Only take over-the-counter or prescription medicines for pain, discomfort, or fever as directed by your health care provider.  Make sure you and your family fully understand everything about the medicines given to you, including what side effects may occur.  You should not drink alcohol, take sleeping pills, or take medicines that cause drowsiness for at least 24 hours.  If you smoke, do not smoke without supervision.  If you are feeling better, you may resume normal activities 24 hours after you were sedated.  Keep all appointments with your health care provider. SEEK MEDICAL CARE IF:  Your skin is pale or bluish in color.  You continue to feel nauseous or vomit.  Your pain is getting worse and is not helped by medicine.  You have bleeding or swelling.  You are still sleepy or  feeling clumsy after 24 hours. SEEK IMMEDIATE MEDICAL CARE IF:  You develop a rash.  You have difficulty breathing.  You develop any type of allergic problem.  You have a fever. MAKE SURE YOU:  Understand these instructions.  Will watch your condition.  Will get help right away if you are not doing well or get worse. Document Released: 05/01/2013 Document Reviewed: 05/01/2013 [facility] Patient Information 2015 [place], [place]. This information is not intended to replace advice given to you by your health care provider. Make sure you discuss any questions you have with your health care provider.   Ibritumomab Tiuxetan injection What is this medicine? IBRITUMOMAB TIUXETAN (EYE bri TOOM oh mab) is a chemotherapy drug. This medicine allows radiation to target specific kinds of white blood cells. It is used to treat non-Hodgkin's lymphoma. This medicine may be used for other purposes; ask your health care provider or pharmacist if you have questions. COMMON BRAND NAME(S): Zevalin for In-111, Zevalin for Y-90 What should I tell my health care provider before I take this medicine? They need to know if you have any of these conditions: -blood disorders -heart disease -infection (especially a virus infection such as chickenpox, cold sores, or herpes) -irregular heartbeat -kidney disease -low blood counts -lung or breathing disease, like asthma -an unusual or allergic reaction to ibritumomab, rituximab, albumin, mouse proteins, medicines used during bone scans, other medicines, foods, dyes, or preservatives -pregnant or trying to get pregnant -breast-feeding How should I use this medicine? This medicine is for infusion into a vein. It is  administered in a hospital or clinic by a specially trained health care professional. Talk to your pediatrician regarding the use of this medicine in children. Special care may be needed. Overdosage: If you think you have taken too much of this medicine  contact a poison control center or emergency room at once. NOTE: This medicine is only for you. Do not share this medicine with others. What if I miss a dose? It is important not to miss a dose. Call your doctor or health care professional if you are unable to keep an appointment. What may interact with this medicine? -medicines for growth factor treatment -medicines that treat or prevent blood clots like warfarin, enoxaparin, and dalteparin -vaccines Talk to your doctor or health care professional before taking any of these over-the-counter medicines: -acetaminophen -aspirin -ibuprofen -ketoprofen -naproxen This list may not describe all possible interactions. Give your health care provider a list of all the medicines, herbs, non-prescription drugs, or dietary supplements you use. Also tell them if you smoke, drink alcohol, or use illegal drugs. Some items may interact with your medicine. What should I watch for while using this medicine? Report any side effects that you notice during your treatment right away, such as changes in your breathing, fever, chills, dizziness or lightheadedness. These effects are more common with the first dose. Visit your prescriber or health care professional for checks on your progress. You will need to have regular blood work. Report any other side effects. The side effects can continue after you finish your treatment. Continue your course of treatment even though you feel ill unless your doctor tells you to stop. Call your doctor or health care professional for advice if you get a fever, chills or sore throat, or other symptoms of a cold or flu. Do not treat yourself. This drug decreases your body's ability to fight infections. Try to avoid being around people who are sick. This medicine may increase your risk to bruise or bleed. Call your doctor or health care professional if you notice any unusual bleeding. Be careful brushing and flossing your teeth or using a  toothpick because you may get an infection or bleed more easily. If you have any dental work done, tell your dentist you are receiving this medicine. Avoid taking products that contain aspirin, acetaminophen, ibuprofen, naproxen, or ketoprofen unless instructed by your doctor. These medicines may hide a fever. Do not become pregnant while taking this medicine. Women should inform their doctor if they wish to become pregnant or think they might be pregnant. There is a potential for serious side effects to an unborn child. Talk to your health care professional or pharmacist for more information. Do not breast-feed an infant while taking this medicine. Talk to your doctor about your risk of cancer. You may be more at risk for certain types of cancers if you take this medicine. What side effects may I notice from receiving this medicine? Side effects that you should report to your doctor or health care professional as soon as possible: -allergic reactions like skin rash, itching or hives, swelling of the face, lips, or tongue -low blood counts - this medicine may decrease the number of white blood cells, red blood cells and platelets. You may be at increased risk for infections and bleeding. -signs of infection - fever or chills, cough, sore throat, pain or difficulty passing urine -signs of decreased platelets or bleeding - bruising, pinpoint red spots on the skin, black, tarry stools, blood in the urine -signs of  decreased red blood cells - unusually weak or tired, fainting spells, lightheadedness -breathing problems -chest pain -fast, irregular heartbeat -feeling faint or lightheaded, falls -mouth sores -redness, blistering, peeling or loosening of the skin, including inside the mouth -swelling of the ankles, feet, or hands Side effects that usually do not require medical attention (report to your doctor or other health care professional if they continue or are  bothersome): -anxiety -diarrhea -flushing -headache -loss of appetite -muscle aches -nausea, vomiting -night sweats This list may not describe all possible side effects. Call your doctor for medical advice about side effects. You may report side effects to FDA at 1-800-FDA-1088. Where should I keep my medicine? This drug is given in a hospital or clinic and will not be stored at home. NOTE: This sheet is a summary. It may not cover all possible information. If you have questions about this medicine, talk to your doctor, pharmacist, or health care provider.  2015, [employer] Standard. (2012-04-06 11:02:08)  Post Y-90 Radioembolization Discharge Instructions  You have been given a radioactive material during your procedure.  While it is safe for you to be discharged home from the hospital, you need to proceed directly home.    Do not use public transportation, including air travel, lasting more than 2 hours for 1 week.  Avoid crowded public places for 1 week.  Adult visitors should try to avoid close contact with you for 1 week.    Children and pregnant females should not visit or have close contact with you for 1 week.  Items that you touch are not radioactive.  Do not sleep in the same bed as your partner for 1 week, and a condom should be used for sexual activity during the first 24 hours.  Your blood may be radioactive and caution should be used if any bleeding occurs during the recovery period.  Body fluids may be radioactive for 24 hours.  Wash your hands after voiding.  Men should sit to urinate.  Dispose of any soiled materials (flush down toilet or place in trash at home) during the first day.  Drink 6 to 8 glasses of fluids per day for 5 days to hydrate yourself.  If you need to see a doctor during the first week, you must let them know that you were treated with yttrium-90 microspheres, and will be slightly radioactive.  They can call Interventional Radiology  [phone] with any questions.

## 2014-03-17 NOTE — Procedures (Signed)
Interventional Radiology Procedure Note  Procedure: 1.) Hepatic angiogram 2.) Partial hepatic bland embolization 3.) Repeat Tc-MAA HPS study Complications: None immediate Recommendations: - Bedrest x 4 hrs - Y90 vs. DEB-TACE next week  Signed,  Criselda Peaches, MD [facility]

## 2014-03-18 ENCOUNTER — Telehealth: Payer: Self-pay | Admitting: Medical Oncology

## 2014-03-18 NOTE — Telephone Encounter (Signed)
Call from [facility] [place] stating pt's daughter called them for consult. Vicky asking if Dr.Gorsuch agrees pt is hospice ready and will Dr. Alvy Bimler be the attending.  Reviewed with MD, and informed Vicky that per Dr. Alvy Bimler yes on both questions. Verbal understanding given denies further questions.

## 2014-03-19 ENCOUNTER — Telehealth: Payer: Self-pay | Admitting: *Deleted

## 2014-03-19 NOTE — Telephone Encounter (Signed)
Yes, ok 

## 2014-03-19 NOTE — Telephone Encounter (Signed)
Informed Sula Soda ok for Hospice MD to sign DNR per Dr. Alvy Bimler.

## 2014-03-19 NOTE — Telephone Encounter (Signed)
Call from [place], Sula Soda,  States pt requesting DNR in home.  She asks if ok w/ Dr. Alvy Bimler for Hospice MD to sign DNR form?

## 2014-03-21 ENCOUNTER — Encounter (INDEPENDENT_AMBULATORY_CARE_PROVIDER_SITE_OTHER): Payer: Medicare Other | Admitting: General Surgery

## 2014-03-26 ENCOUNTER — Other Ambulatory Visit (HOSPITAL_COMMUNITY): Payer: Medicare Other

## 2014-03-26 ENCOUNTER — Ambulatory Visit (HOSPITAL_COMMUNITY): Admission: RE | Admit: 2014-03-26 | Payer: Medicare Other | Source: Ambulatory Visit

## 2014-03-26 ENCOUNTER — Encounter (HOSPITAL_COMMUNITY): Payer: Medicare Other

## 2014-05-19 ENCOUNTER — Telehealth: Payer: Self-pay | Admitting: *Deleted

## 2014-05-19 NOTE — Telephone Encounter (Signed)
Pt requests her Thyroid lab results be faxed to her PCP.  Faxed TSH, Free T4 from May 2015 to Dr. Lennette Bihari Little's office.

## 2014-05-20 ENCOUNTER — Other Ambulatory Visit: Payer: Self-pay | Admitting: Oncology

## 2014-07-07 ENCOUNTER — Encounter (HOSPITAL_BASED_OUTPATIENT_CLINIC_OR_DEPARTMENT_OTHER): Payer: Self-pay

## 2014-07-07 ENCOUNTER — Emergency Department (HOSPITAL_BASED_OUTPATIENT_CLINIC_OR_DEPARTMENT_OTHER)
Admission: EM | Admit: 2014-07-07 | Discharge: 2014-07-07 | Disposition: A | Discharge status: Home / Self Care | Attending: Emergency Medicine | Admitting: Emergency Medicine

## 2014-07-07 DIAGNOSIS — Z85820 Personal history of malignant melanoma of skin: Secondary | ICD-10-CM | POA: Diagnosis not present

## 2014-07-07 DIAGNOSIS — Z79899 Other long term (current) drug therapy: Secondary | ICD-10-CM | POA: Diagnosis not present

## 2014-07-07 DIAGNOSIS — Z87891 Personal history of nicotine dependence: Secondary | ICD-10-CM | POA: Diagnosis not present

## 2014-07-07 DIAGNOSIS — E039 Hypothyroidism, unspecified: Secondary | ICD-10-CM | POA: Diagnosis not present

## 2014-07-07 DIAGNOSIS — M1990 Unspecified osteoarthritis, unspecified site: Secondary | ICD-10-CM | POA: Insufficient documentation

## 2014-07-07 DIAGNOSIS — I10 Essential (primary) hypertension: Secondary | ICD-10-CM | POA: Diagnosis not present

## 2014-07-07 DIAGNOSIS — R040 Epistaxis: Secondary | ICD-10-CM | POA: Insufficient documentation

## 2014-07-07 DIAGNOSIS — Z853 Personal history of malignant neoplasm of breast: Secondary | ICD-10-CM | POA: Insufficient documentation

## 2014-07-07 MED ORDER — OXYMETAZOLINE HCL 0.05 % NA SOLN
NASAL | Status: AC
Start: 2014-07-07 — End: 2014-07-07
  Administered 2014-07-07: 23:00:00
  Filled 2014-07-07: qty 15

## 2014-07-07 NOTE — ED Notes (Signed)
Out to speak with pt/ family, as CN, about plan, process and wait. Pt/family denied questions.

## 2014-07-07 NOTE — ED Notes (Signed)
Family at the nurses station asking for the RN to come into room. This RN aware and went into the room. Family member wanting to know when the patient was going to be seen that they had waiting too long. This RN explained how patients are seen. The patient asked if anything had changed, or if she felt worse. Bleeding remains controlled, patient is in no distress, and smiling. Family member very upset that they have had to wait.

## 2014-07-07 NOTE — ED Notes (Signed)
Unable to obtain patients oral temperature due to patient inability to keep mouth closed due to expressing she could not breathe.  Family member angry demanding water and ice chips for patient.  Also got angry due to this RN typing while she explained while patient was brought to ER.  Explained I need to type their complaints in the computer for the doctor to see.  Family also angry about wait since hospice called and warned ER about patient being enroute to facility.  Family reported they expected to be taken straight to a room.  At the time all rooms were occupied including 14.  Explained there would be a wait and family member began crying and left the triage room.

## 2014-07-07 NOTE — ED Provider Notes (Signed)
CSN: [id number]     Arrival date & time 07/07/14  2057 History   First MD Initiated Contact with Patient 07/07/14 2225     Chief Complaint  Patient presents with  . Epistaxis     (Consider location/radiation/quality/duration/timing/severity/associated sxs/prior Treatment) HPI Patient developed epistaxis from right nostril this morning. Bleeding stopped temporarily started again this evening she manages to get bleeding.by putting a tampon into her nose. Patient otherwise asymptomatic nothing makes symptoms better or worse. Past Medical History  Diagnosis Date  . Osteoarthritis   . Hypertension   . Hypertriglyceridemia   . History of breast cancer 02/22/2012  . Breast cancer 01/2009  . Melanoma 2006    stage I; right anterior chest wall; s/p resection   . Hypothyroidism    Past Surgical History  Procedure Laterality Date  . Skin biopsy    . Ectopic pregnancy surgery    . Tubal ligation    . Breast lumpectomy      bilateral  . Melanoma excision    . Tonsillectomy     Family History  Problem Relation Age of Onset  . Cancer Mother     breast ca  . Cancer Brother     ?lung ca   History  Substance Use Topics  . Smoking status: Former Smoker -- 0.50 packs/day for 4 years    Types: Cigarettes    Quit date: 07/25/1966  . Smokeless tobacco: Never Used  . Alcohol Use: 4.2 oz/week    7 Glasses of wine per week   OB History    No data available     Review of Systems  HENT:       Epistaxis  Hematological: Bruises/bleeds easily.       Suspected easy bleeding as result of liver cancer  All other systems reviewed and are negative.     Allergies  Codeine and Tetanus toxoids  Home Medications   Prior to Admission medications   Medication Sig Start Date End Date Taking? Authorizing Provider  senna (SENOKOT) 8.6 MG TABS tablet Take 1 tablet by mouth.   Yes Historical Provider, MD  ibuprofen (ADVIL,MOTRIN) 200 MG tablet Take 400-800 mg by mouth every 6 (six) hours as  needed for headache or moderate pain.     Historical Provider, MD  levothyroxine (SYNTHROID, LEVOTHROID) 100 MCG tablet Take 100 mcg by mouth daily before breakfast.     Historical Provider, MD  LORazepam (ATIVAN) 1 MG tablet Take 1 mg by mouth 2 (two) times daily as needed for anxiety or sleep.    Historical Provider, MD  losartan (COZAAR) 100 MG tablet Take 100 mg by mouth every morning.     Historical Provider, MD   BP 123/56 mmHg  Pulse 71  Temp(Src)   Resp 18  Ht 5\' 8"  (1.727 m)  Wt 145 lb (65.772 kg)  BMI 22.05 kg/m2  SpO2 92% Physical Exam  Constitutional: She appears well-developed and well-nourished.  HENT:  Head: Normocephalic and atraumatic.  Right nare with tampon in place, no active bleeding  Eyes: Conjunctivae are normal. Pupils are equal, round, and reactive to light.  Neck: Neck supple. No tracheal deviation present. No thyromegaly present.  Cardiovascular: Normal rate and regular rhythm.   No murmur heard. Pulmonary/Chest: Effort normal and breath sounds normal.  Abdominal: Soft. Bowel sounds are normal. She exhibits no distension. There is no tenderness.  Musculoskeletal: Normal range of motion. She exhibits no edema or tenderness.  Neurological: She is alert. Coordination normal.  Skin: Skin is  warm and dry. No rash noted.  Psychiatric: She has a normal mood and affect.  Nursing note and vitals reviewed.   ED Course  Procedures (including critical care time) Labs Review Labs Reviewed - No data to display  Imaging Review No results found.   EKG Interpretation None     procedure timeout performed tampon removed from nose. Clots cleared from nose by patient blowing and by wall suction using Frazier tip suction. Nose inspected with nasal speculum using headlamp. Bleeding site at distal septum. Attempted cauterization with silver nitrate sticks, with out adequate hemostasis. Rapid Rhino 5.5 cm inserted into right nare with good hemostasis  MDM  Plan packing  to be pulled out in 3 days Final diagnoses:  None   Diagnosis anterior epistaxis     Orlie Dakin, MD 07/07/14 2317

## 2014-07-07 NOTE — Discharge Instructions (Signed)
Nosebleed Spray saline nasal spray interior nose every 2 hours all awake to prevent drying. Nasal packing should be pulled out in 3 days. If your nose rebleeds pinch her nostrils shut for 20 minutes with your fingers. If you can't get the bleeding to stop, return here. A nosebleed can be caused by many things, including:  Getting hit hard in the nose.  Infections.  Dry nose.  Colds.  Medicines. Your doctor may do lab testing if you get nosebleeds a lot and the cause is not known. HOME CARE   If your nose was packed with material, keep it there until your doctor takes it out. Put the pack back in your nose if the pack falls out.  Do not blow your nose for 12 hours after the nosebleed.  Sit up and bend forward if your nose starts bleeding again. Pinch the front half of your nose nonstop for 20 minutes.  Put petroleum jelly inside your nose every morning if you have a dry nose.  Use a humidifier to make the air less dry.  Do not take aspirin.  Try not to strain, lift, or bend at the waist for many days after the nosebleed. GET HELP RIGHT AWAY IF:   Nosebleeds keep happening and are hard to stop or control.  You have bleeding or bruises that are not normal on other parts of the body.  You have a fever.  The nosebleeds get worse.  You get lightheaded, feel faint, sweaty, or throw up (vomit) blood. MAKE SURE YOU:   Understand these instructions.  Will watch your condition.  Will get help right away if you are not doing well or get worse. Document Released: 04/19/2008 Document Revised: 10/03/2011 Document Reviewed: 04/19/2008 [facility] Patient Information 2015 [place], [place]. This information is not intended to replace advice given to you by your health care provider. Make sure you discuss any questions you have with your health care provider.

## 2014-07-07 NOTE — ED Notes (Signed)
Nosebleed that started earlier today.  Reports nosebleed has been intermittent.  Pt reports dry mouth from medication she takes.  Family and patient are demanding water for patient.

## 2014-07-09 ENCOUNTER — Encounter (HOSPITAL_COMMUNITY): Payer: Self-pay | Admitting: Emergency Medicine

## 2014-07-09 ENCOUNTER — Emergency Department (HOSPITAL_COMMUNITY)
Admission: EM | Admit: 2014-07-09 | Discharge: 2014-07-09 | Disposition: A | Discharge status: Home / Self Care | Attending: Emergency Medicine | Admitting: Emergency Medicine

## 2014-07-09 DIAGNOSIS — R42 Dizziness and giddiness: Secondary | ICD-10-CM | POA: Diagnosis not present

## 2014-07-09 DIAGNOSIS — Z87891 Personal history of nicotine dependence: Secondary | ICD-10-CM | POA: Insufficient documentation

## 2014-07-09 DIAGNOSIS — Z79899 Other long term (current) drug therapy: Secondary | ICD-10-CM | POA: Diagnosis not present

## 2014-07-09 DIAGNOSIS — E039 Hypothyroidism, unspecified: Secondary | ICD-10-CM | POA: Diagnosis not present

## 2014-07-09 DIAGNOSIS — R040 Epistaxis: Secondary | ICD-10-CM | POA: Diagnosis not present

## 2014-07-09 DIAGNOSIS — J3489 Other specified disorders of nose and nasal sinuses: Secondary | ICD-10-CM | POA: Insufficient documentation

## 2014-07-09 DIAGNOSIS — M1990 Unspecified osteoarthritis, unspecified site: Secondary | ICD-10-CM | POA: Insufficient documentation

## 2014-07-09 DIAGNOSIS — R5383 Other fatigue: Secondary | ICD-10-CM | POA: Diagnosis not present

## 2014-07-09 DIAGNOSIS — Z85820 Personal history of malignant melanoma of skin: Secondary | ICD-10-CM | POA: Insufficient documentation

## 2014-07-09 DIAGNOSIS — R1011 Right upper quadrant pain: Secondary | ICD-10-CM | POA: Insufficient documentation

## 2014-07-09 DIAGNOSIS — Z4800 Encounter for change or removal of nonsurgical wound dressing: Secondary | ICD-10-CM | POA: Insufficient documentation

## 2014-07-09 DIAGNOSIS — I10 Essential (primary) hypertension: Secondary | ICD-10-CM | POA: Diagnosis not present

## 2014-07-09 DIAGNOSIS — Z853 Personal history of malignant neoplasm of breast: Secondary | ICD-10-CM | POA: Diagnosis not present

## 2014-07-09 LAB — I-STAT CHEM 8, ED
BUN: 26 mg/dL — AB (ref 6–23)
CREATININE: 1 mg/dL (ref 0.50–1.10)
Calcium, Ion: 1.09 mmol/L — ABNORMAL LOW (ref 1.13–1.30)
Chloride: 105 mEq/L (ref 96–112)
Glucose, Bld: 85 mg/dL (ref 70–99)
HCT: 39 % (ref 36.0–46.0)
Hemoglobin: 13.3 g/dL (ref 12.0–15.0)
Potassium: 4.4 mEq/L (ref 3.7–5.3)
SODIUM: 133 meq/L — AB (ref 137–147)
TCO2: 15 mmol/L (ref 0–100)

## 2014-07-09 MED ORDER — SODIUM CHLORIDE 0.9 % IV BOLUS (SEPSIS)
1000.0000 mL | Freq: Once | INTRAVENOUS | Status: AC
Start: 2014-07-09 — End: 2014-07-09
  Administered 2014-07-09: 1000 mL via INTRAVENOUS

## 2014-07-09 NOTE — ED Provider Notes (Signed)
CSN: [id number]     Arrival date & time 07/09/14  1326 History   First MD Initiated Contact with Patient 07/09/14 1335     Chief Complaint  Patient presents with  . Epistaxis     (Consider location/radiation/quality/duration/timing/severity/associated sxs/prior Treatment) HPI Comments: Shelby Hood is a 73 y.o. female with a PMHx of OA, HTN, HLD, breast cancer, hypothyroidism, and recent dx of liver cancer, who presents to the ED with complaints of ongoing epistaxis that began 2 days ago, has been improved by rhino rocket placed at [facility] on 07/07/14, but has noticed ongoing dripping around the packing. States the amount of blood is scant, and no where near the quantity it was originally on 07/07/14 (at which time it bled for >1hr when she first presented). She states the bleeding has lead to a small amount of crusted dried blood around her R nare, surrounding the rhino rocket. Blood that drips out is bright red, <1/4 cup in quantity although pt admits she's not sure. States she has a recent dx of liver cancer and therefore bleeds easily. Endorses some lightheadedness with standing which has been present since her initial epistaxis. Also states she has generalized weakness/fatigue which is ongoing. Denies HA, vision changes, tinnitus, vertigo, syncope, eye pain/drainage, ear pain/drainage, sore throat, postnasal drip, fevers, chills, CP, SOB, abd pain, N/V/D/C, hematuria, dysuria, myalgias, arthralgias, focal weakness, paresthesias, focal neuro deficits, other bleeding, or rashes. Currently on hospice. Has not tried anything for her symptoms, and no known digital trauma that lead to this epistaxis (states she just blew her nose and it began).   Patient is a 73 y.o. female presenting with nosebleeds. The history is provided by the patient. No language interpreter was used.  Epistaxis Location:  R nare Severity:  Mild Duration:  2 days Timing:  Intermittent Progression:  Improving Chronicity:   New Context: bleeding disorder   Context comment:  Liver cancer Relieved by:  Nasal tampon Worsened by:  Nothing tried Ineffective treatments:  None tried Associated symptoms: sinus pain   Associated symptoms: no blood in oropharynx, no congestion, no cough, no dizziness, no facial pain, no fever, no headaches, no sneezing, no sore throat and no syncope   Risk factors: liver disease     Past Medical History  Diagnosis Date  . Osteoarthritis   . Hypertension   . Hypertriglyceridemia   . History of breast cancer 02/22/2012  . Breast cancer 01/2009  . Melanoma 2006    stage I; right anterior chest wall; s/p resection   . Hypothyroidism    Past Surgical History  Procedure Laterality Date  . Skin biopsy    . Ectopic pregnancy surgery    . Tubal ligation    . Breast lumpectomy      bilateral  . Melanoma excision    . Tonsillectomy     Family History  Problem Relation Age of Onset  . Cancer Mother     breast ca  . Cancer Brother     ?lung ca   History  Substance Use Topics  . Smoking status: Former Smoker -- 0.50 packs/day for 4 years    Types: Cigarettes    Quit date: 07/25/1966  . Smokeless tobacco: Never Used  . Alcohol Use: 4.2 oz/week    7 Glasses of wine per week   OB History    No data available     Review of Systems  Constitutional: Positive for fatigue. Negative for fever and chills.  HENT: Positive for nosebleeds. Negative  for congestion, ear discharge, ear pain, facial swelling, hearing loss, postnasal drip, rhinorrhea, sinus pressure, sneezing, sore throat, tinnitus and trouble swallowing.   Eyes: Negative for pain, discharge and visual disturbance.  Respiratory: Negative for cough and shortness of breath.   Cardiovascular: Negative for chest pain, palpitations and syncope.  Gastrointestinal: Negative for nausea, vomiting, abdominal pain, diarrhea, constipation and blood in stool.  Genitourinary: Negative for dysuria, urgency, hematuria, vaginal bleeding  and vaginal discharge.  Musculoskeletal: Negative for myalgias, back pain, arthralgias and gait problem.  Skin: Negative for color change.  Neurological: Positive for light-headedness. Negative for dizziness, syncope, weakness, numbness and headaches.  Hematological: Bruises/bleeds easily.  Psychiatric/Behavioral: Negative for confusion.   10 Systems reviewed and are negative for acute change except as noted in the HPI.    Allergies  Codeine and Tetanus toxoids  Home Medications   Prior to Admission medications   Medication Sig Start Date End Date Taking? Authorizing Provider  ibuprofen (ADVIL,MOTRIN) 200 MG tablet Take 400-800 mg by mouth every 6 (six) hours as needed for headache or moderate pain.     Historical Provider, MD  levothyroxine (SYNTHROID, LEVOTHROID) 100 MCG tablet Take 100 mcg by mouth daily before breakfast.     Historical Provider, MD  LORazepam (ATIVAN) 1 MG tablet Take 1 mg by mouth 2 (two) times daily as needed for anxiety or sleep.    Historical Provider, MD  losartan (COZAAR) 100 MG tablet Take 100 mg by mouth every morning.     Historical Provider, MD  senna (SENOKOT) 8.6 MG TABS tablet Take 1 tablet by mouth.    Historical Provider, MD   BP 123/50 mmHg  Pulse 79  Temp(Src) 97.9 F (36.6 C) (Oral)  Resp 19  SpO2 90% Physical Exam  Constitutional: She is oriented to person, place, and time. Vital signs are normal. She appears well-developed.  Non-toxic appearance. No distress.  Afebrile, nontoxic, thin frail woman with VSS similar to prior visits  HENT:  Head: Normocephalic and atraumatic.  Right Ear: Hearing, tympanic membrane, external ear and ear canal normal.  Left Ear: Hearing, tympanic membrane, external ear and ear canal normal.  Nose: Rhinorrhea (clear) present. No mucosal edema, nose lacerations or sinus tenderness. Epistaxis (R nare) is observed.    Mouth/Throat: Oropharynx is clear and moist and mucous membranes are normal.  R nare with dried  crusted blood exteriorly around rhino rocket. After removal of packing, small scab noted to lateral aspect of nare with small area of erythema inferiorly to the area, no active bleeding, mild clear rhinorrhea noted. No septal hematoma or deformity. L nare clear. Posterior oropharynx clear without evidence of posterior nasal drip/bleeding, no erythema or tonsillar swelling  Eyes: Conjunctivae and EOM are normal. Right eye exhibits no discharge. Left eye exhibits no discharge.  Neck: Normal range of motion. Neck supple.  Cardiovascular: Normal rate, regular rhythm, normal heart sounds and intact distal pulses.  Exam reveals no gallop and no friction rub.   No murmur heard. RRR, nl s1/s2, no m/r/g, distal pulses intact  Pulmonary/Chest: Effort normal and breath sounds normal. No respiratory distress. She has no decreased breath sounds. She has no wheezes. She has no rhonchi. She has no rales.  Abdominal: Soft. Normal appearance and bowel sounds are normal. She exhibits no distension. There is hepatomegaly. There is tenderness in the right upper quadrant. There is no rigidity, no rebound, no guarding, no CVA tenderness, no tenderness at McBurney's point and negative Murphy's sign.    Soft, ND, +  BS throughout, mild RUQ tenderness which is chronic per pt, no r/g/r, neg mcburney's, no CVA TTP, +Hepatomegaly  Musculoskeletal: Normal range of motion.  Strength and sensation intact and at baseline MAE x4  Neurological: She is alert and oriented to person, place, and time. She has normal strength. No sensory deficit.  No focal deficits Strength and sensation intact  Skin: Skin is warm, dry and intact. No rash noted.  Psychiatric: She has a normal mood and affect.  Nursing note and vitals reviewed.   ED Course  Procedures (including critical care time)  Orthostatic Lying  - BP- Lying: 123/49 mmHg ; Pulse- Lying: 79  Orthostatic Sitting - BP- Sitting: 132/57 mmHg ; Pulse- Sitting: 84  Orthostatic  Standing at 0 minutes - BP- Standing at 0 minutes: 139/54 mmHg ; Pulse- Standing at 0 minutes: 87  Labs Review Labs Reviewed  I-STAT CHEM 8, ED - Abnormal; Notable for the following:    Sodium 133 (*)    BUN 26 (*)    Calcium, Ion 1.09 (*)    All other components within normal limits    Imaging Review No results found.   EKG Interpretation None      MDM   Final diagnoses:  Right-sided epistaxis  Encounter for removal of nasal packing    73 y.o. female with ongoing nasal bleeding after rhino rocket placement 2 days ago. Slight lightheadedness with standing, will get istat and orthostatics. VSS although BP 123/50, will give bolus. Will reassess shortly, plan for removal of rhino rocket and assess for ongoing bleeding.  3:25 PM Chem 8 showing BUN 26, Cr 1, H/H stable with no anemia. BP improved after 1L bolus. Orthostatics negative. Rhino rocket removed, large scabbed area on lateral nare noted, no ongoing epistaxis. Discussed avoidance of trauma or irritation to nose, gentle hydration with nasal saline to lubricate but avoidance of overlubrication that would lead to softening of nasal scab. Discussed use of Afrin as needed for small amount of bleeding, but if bleeding persisted or worsened to either call ENT or return to the ER. Pt and her family understand and agree to her plan. I explained the diagnosis and have given explicit precautions to return to the ER including for any other new or worsening symptoms. The patient understands and accepts the medical plan as it's been dictated and I have answered their questions. Discharge instructions concerning home care and prescriptions have been given. The patient is STABLE and is discharged to home in good condition.  BP 139/54 mmHg  Pulse 85  Temp(Src) 97.9 F (36.6 C) (Oral)  Resp 20  SpO2 90%  Meds ordered this encounter  Medications  . sodium chloride 0.9 % bolus 1,000 mL    Sig:      Patty Sermons [place],  PA-C 07/09/14 [place]. Alvino Chapel, MD 07/10/14 [phone]

## 2014-07-09 NOTE — ED Notes (Signed)
Per EMS pt seen at [facility] for epistaxis. Pt has right nare packing scheduled to be taken out tomorrow but new onset of dark red bleeding to right nare this morning.

## 2014-07-09 NOTE — Discharge Instructions (Signed)
Avoid any injury to your nose. You may use afrin spray as directed on the packaging for any small amounts of bleeding, but if it doesn't help stop the bleeding within 15 minutes, or if the bleeding is significant in quantity, return to the ER. Call the [place] doctor as needed for any ongoing bleeding. Use gentle nasal saline as needed to keep your nose moist, but avoid over-moistening the nose to avoid removing the scab prematurely. Return to the ER for changes or worsening symptoms.   Nosebleed Nosebleeds can be caused by many conditions, including trauma, infections, polyps, foreign bodies, dry mucous membranes or climate, medicines, and air conditioning. Most nosebleeds occur in the front of the nose. Because of this location, most nosebleeds can be controlled by pinching the nostrils gently and continuously for at least 10 to 20 minutes. The long, continuous pressure allows enough time for the blood to clot. If pressure is released during that 10 to 20 minute time period, the process may have to be started again. The nosebleed may stop by itself or quit with pressure, or it may need concentrated heating (cautery) or pressure from packing. HOME CARE INSTRUCTIONS   If your nose was packed, try to maintain the pack inside until your health care provider removes it. If a gauze pack was used and it starts to fall out, gently replace it or cut the end off. Do not cut if a balloon catheter was used to pack the nose. Otherwise, do not remove unless instructed.  Avoid blowing your nose for 12 hours after treatment. This could dislodge the pack or clot and start the bleeding again.  If the bleeding starts again, sit up and bend forward, gently pinching the front half of your nose continuously for 20 minutes.  If bleeding was caused by dry mucous membranes, use over-the-counter saline nasal spray or gel. This will keep the mucous membranes moist and allow them to heal. If you must use a lubricant,  choose the water-soluble variety. Use it only sparingly and not within several hours of lying down.  Do not use petroleum jelly or mineral oil, as these may drip into the lungs and cause serious problems.  Maintain humidity in your home by using less air conditioning or by using a humidifier.  Do not use aspirin or medicines which make bleeding more likely. Your health care provider can give you recommendations on this.  Resume normal activities as you are able, but try to avoid straining, lifting, or bending at the waist for several days.  If the nosebleeds become recurrent and the cause is unknown, your health care provider may suggest laboratory tests. SEEK MEDICAL CARE IF: You have a fever. SEEK IMMEDIATE MEDICAL CARE IF:   Bleeding recurs and cannot be controlled.  There is unusual bleeding from or bruising on other parts of the body.  Nosebleeds continue.  There is any worsening of the condition which originally brought you in.  You become light-headed, feel faint, become sweaty, or vomit blood. MAKE SURE YOU:   Understand these instructions.  Will watch your condition.  Will get help right away if you are not doing well or get worse. Document Released: 04/20/2005 Document Revised: 11/25/2013 Document Reviewed: 06/11/2009 [facility] Patient Information 2015 [place], [place]. This information is not intended to replace advice given to you by your health care provider. Make sure you discuss any questions you have with your health care provider.

## 2014-07-09 NOTE — ED Notes (Signed)
Bed: WA06 Expected date:  Expected time:  Means of arrival:  Comments: ems 

## 2014-07-14 ENCOUNTER — Telehealth: Payer: Self-pay | Admitting: *Deleted

## 2014-07-15 ENCOUNTER — Telehealth: Payer: Self-pay

## 2014-07-15 NOTE — Telephone Encounter (Signed)
[facility] orders faxed to [phone].

## 2014-07-25 NOTE — Telephone Encounter (Signed)
Call from RN at [facility],  [place] is being admitted to [facility] today for End of life care.  Dr. Alvy Bimler notified.

## 2014-07-25 DEATH — deceased

## 2015-04-20 IMAGING — NM NM LIVER IMG SPECT
4 series · 24 of 24 positions shown · non-contrast
Comparison: CT 02/25/2014

CLINICAL DATA: Unresectable multifocal hepatic cellular carcinoma.
Pre Yttrium 90 evaluation

EXAM:
NUCLEAR MEDICINE LIVER SCAN; SPECT of the liver
TECHNIQUE: Abdominal images were obtained in multiple projections after
intrahepatic arterial injection of radiopharmaceutical. SPECT
imaging was performed. Lung shunt calculation was performed.
RADIOPHARMACEUTICALS:  6.VHD55D KERELOS ALLH TECHNETIUM TO 99M ALBUMIN
AGGREGATED

[Series 1: mc (age) micro · 4.7mm · 4.75mm/px · 6 of 91 frames shown (1 of 4)]
[frame 8/91]
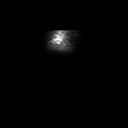
[frame 23/91]
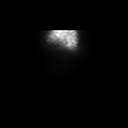
[frame 38/91]
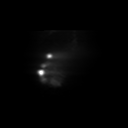
[frame 53/91]
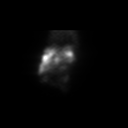
[frame 68/91]
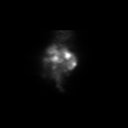
[frame 84/91]
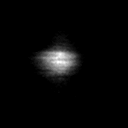

[Series 1: mc (age) micro · 4.7mm · 4.75mm/px · 6 of 91 frames shown (2 of 4)]
[frame 8/91]
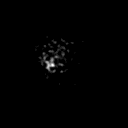
[frame 23/91]
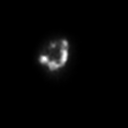
[frame 38/91]
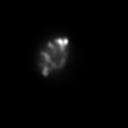
[frame 53/91]
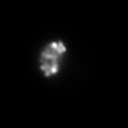
[frame 68/91]
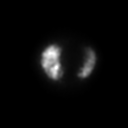
[frame 84/91]
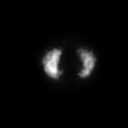

[Series 1: mc (age) micro · 4.75mm/px · 6 of 64 frames shown (3 of 4)]
[frame 6/64]
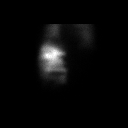
[frame 16/64]
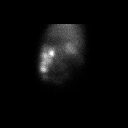
[frame 27/64]
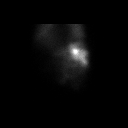
[frame 38/64]
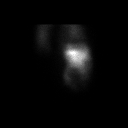
[frame 48/64]
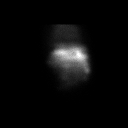
[frame 59/64]
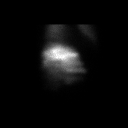

[Series 1: mc (age) micro · 4.7mm · 4.75mm/px · 6 of 91 frames shown (4 of 4)]
[frame 8/91]
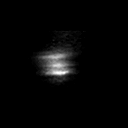
[frame 23/91]
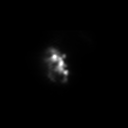
[frame 38/91]
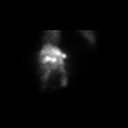
[frame 53/91]
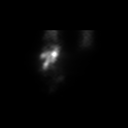
[frame 68/91]
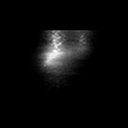
[frame 84/91]
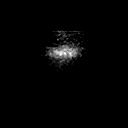

[24 of 24 positions shown; findings below may reference images not displayed]

FINDINGS: The injected microaggregated albumin localizes predominantly in the
large right hepatic lobe. No evidence of activity within the
stomach, duodenum, or bowel. There is significant activity within
the lungs.

Calculated shunt fraction to the lungs equals 28%.
IMPRESSION: 1. Significant radiotracer shunt to the lungs.
2. Lung shunt fraction equals 28%.
3. No extrahepatic activity below the diaphragm.

## 2015-04-25 DEATH — deceased
